# Patient Record
Sex: Female | Born: 1935 | Race: White | Hispanic: No | Marital: Married | Smoking: Never smoker
Health system: Southern US, Community
[De-identification: ages and names within clinical notes are randomized; demographics above are authoritative.]

## PROBLEM LIST (undated history)

## (undated) DIAGNOSIS — K219 Gastro-esophageal reflux disease without esophagitis: Secondary | ICD-10-CM

## (undated) DIAGNOSIS — E079 Disorder of thyroid, unspecified: Secondary | ICD-10-CM

## (undated) DIAGNOSIS — I10 Essential (primary) hypertension: Secondary | ICD-10-CM

## (undated) HISTORY — PX: EYE SURGERY: SHX253

## (undated) HISTORY — PX: CHOLECYSTECTOMY: SHX55

## (undated) HISTORY — PX: REPLACEMENT TOTAL KNEE BILATERAL: SUR1225

---

## 2009-06-20 ENCOUNTER — Emergency Department: Payer: Self-pay | Admitting: Unknown Physician Specialty

## 2009-07-07 ENCOUNTER — Emergency Department: Payer: Self-pay | Admitting: Emergency Medicine

## 2011-07-08 ENCOUNTER — Inpatient Hospital Stay: Payer: Self-pay | Admitting: Unknown Physician Specialty

## 2016-07-01 ENCOUNTER — Emergency Department: Payer: Medicare Other

## 2016-07-01 ENCOUNTER — Emergency Department
Admission: EM | Admit: 2016-07-01 | Discharge: 2016-07-01 | Disposition: A | Discharge status: Home / Self Care | Payer: Medicare Other | Attending: Emergency Medicine | Admitting: Emergency Medicine

## 2016-07-01 ENCOUNTER — Encounter: Payer: Self-pay | Admitting: Emergency Medicine

## 2016-07-01 DIAGNOSIS — Y999 Unspecified external cause status: Secondary | ICD-10-CM | POA: Diagnosis not present

## 2016-07-01 DIAGNOSIS — Y92511 Restaurant or cafe as the place of occurrence of the external cause: Secondary | ICD-10-CM | POA: Diagnosis not present

## 2016-07-01 DIAGNOSIS — S46911A Strain of unspecified muscle, fascia and tendon at shoulder and upper arm level, right arm, initial encounter: Secondary | ICD-10-CM | POA: Insufficient documentation

## 2016-07-01 DIAGNOSIS — I10 Essential (primary) hypertension: Secondary | ICD-10-CM | POA: Insufficient documentation

## 2016-07-01 DIAGNOSIS — Y9389 Activity, other specified: Secondary | ICD-10-CM | POA: Diagnosis not present

## 2016-07-01 DIAGNOSIS — W010XXA Fall on same level from slipping, tripping and stumbling without subsequent striking against object, initial encounter: Secondary | ICD-10-CM | POA: Diagnosis not present

## 2016-07-01 DIAGNOSIS — M25511 Pain in right shoulder: Secondary | ICD-10-CM | POA: Diagnosis present

## 2016-07-01 HISTORY — DX: Essential (primary) hypertension: I10

## 2016-07-01 HISTORY — DX: Disorder of thyroid, unspecified: E07.9

## 2016-07-01 NOTE — ED Triage Notes (Signed)
Pt to ED c/o right shoulder pain after fall tonight.  Patient states tripped while at dinner and fell on shoulder, denies hitting head or LOC.  Pt is A&Ox4, speaking in complete and coherent sentences, chest rise even and unlabored, no obvious deformity or swelling.  States pain with movement.  Pt took 2 advil 1hr PTA.

## 2016-07-01 NOTE — Discharge Instructions (Signed)
As we discussed, your shoulder x-rays are abnormal, but it appears chronic.  Since you are not having any significant pain or tenderness with range of motion or use of your arm, we feel it is reasonable for you to go home and use over-the-counter pain medication as needed.  You may follow up with an orthopedic surgeon of your choosing although we do recommend calling Dr. Rosita KeaMenz.    Return to the emergency department if you develop new or worsening symptoms that concern you.

## 2016-07-01 NOTE — ED Provider Notes (Signed)
[facility] Emergency Department Provider Note  ____________________________________________   First MD Initiated Contact with Patient 07/01/16 2118     (approximate)  I have reviewed the triage vital signs and the nursing notes.   HISTORY  Chief Complaint Fall and Shoulder Pain    HPI Katie Coleman is a 82 y.o. female with a history of chronic bilateral shoulder degeneration who presents for evaluation of pain in her right shoulder after a fall at a restaurant tonight.  She had a mechanical fall where she tripped "over my own feet" and landed on her right shoulder.  She did not strike her head, did not lose consciousness, and has no pain in her neck and no headache.  She reports that she had acute onset of moderate pain in the right shoulder near her neck but without any obvious deformity.  She took 2 ibuprofen about an hour prior to arrival and states that the pain is much better now.  She has no limitation of range of motion.  The pain is reproduced when she palpates at the base of her neck on the right side.  She has no other injuries.   Past Medical History:  Diagnosis Date  . Hypertension   . Thyroid disease     There are no active problems to display for this patient.   Past Surgical History:  Procedure Laterality Date  . CHOLECYSTECTOMY    . EYE SURGERY    . REPLACEMENT TOTAL KNEE BILATERAL      Prior to Admission medications   Not on File    Allergies Review of patient's allergies indicates no known allergies.  History reviewed. No pertinent family history.  Social History Social History  Substance Use Topics  . Smoking status: Never Smoker  . Smokeless tobacco: Never Used  . Alcohol use No    Review of Systems Constitutional: No fever/chills Eyes: No visual changes. ENT: No sore throat. Cardiovascular: Denies chest pain. Respiratory: Denies shortness of breath. Gastrointestinal: No abdominal pain.  No nausea, no  vomiting.  No diarrhea.  No constipation. Genitourinary: Negative for dysuria. Musculoskeletal: Pain and tenderness after fall to the base of her neck/right shoulder. Negative for back pain and cervical spinal neck pain. Skin: Negative for rash. Neurological: Negative for headaches, focal weakness or numbness.  10-point ROS otherwise negative.  ____________________________________________   PHYSICAL EXAM:  VITAL SIGNS: ED Triage Vitals  Enc Vitals Group     BP 07/01/16 1923 (!) 216/113     Pulse Rate 07/01/16 1923 66     Resp 07/01/16 1923 16     Temp 07/01/16 1923 97.7 F (36.5 C)     Temp Source 07/01/16 1923 Oral     SpO2 07/01/16 1923 96 %     Weight 07/01/16 1923 175 lb (79.4 kg)     Height 07/01/16 1923 5\' 6"  (1.676 m)     Head Circumference --      Peak Flow --      Pain Score 07/01/16 1924 3     Pain Loc --      Pain Edu? --      Excl. in GC? --     Constitutional: Alert and oriented. Well appearing and in no acute distress. Eyes: Conjunctivae are normal. PERRL. EOMI. Head: Atraumatic. Nose: No congestion/rhinnorhea. Mouth/Throat: Mucous membranes are moist.  Oropharynx non-erythematous. Neck: No stridor.  No meningeal signs.  No cervical spine tenderness to palpation. Cardiovascular: Normal rate, regular rhythm. Good peripheral circulation. Grossly  normal heart sounds. Respiratory: Normal respiratory effort.  No retractions. Lungs CTAB. Gastrointestinal: Soft and nontender. No distention.  Musculoskeletal: Mild tenderness to palpation of the soft tissues at the base of the neck and the start of the right shoulder.  She has no bony tenderness to palpation and normal and nontender range of motion of the right shoulder.  No other injuries are evident Neurologic:  Normal speech and language. No gross focal neurologic deficits are appreciated.  Skin:  Skin is warm, dry and intact. No rash noted. Psychiatric: Mood and affect are normal. Speech and behavior are  normal.  ____________________________________________   LABS (all labs ordered are listed, but only abnormal results are displayed)  Labs Reviewed - No data to display ____________________________________________  EKG  None - EKG not ordered by ED physician ____________________________________________  RADIOLOGY   Dg Shoulder Right  Result Date: 07/01/2016 CLINICAL DATA:  Right shoulder pain after fall tonight. EXAM: RIGHT SHOULDER - 2+ VIEW COMPARISON:  None. FINDINGS: Deformity of the right proximal humerus with resorption of the right humeral head on a chronic basis. This had started back on the chest radiograph of 2012 but has clearly progressed since that time. The slight convexity of the remaining proximal humeral metaphysis pseudo articulates with the glenoid. There is a linear calcification below the glenohumeral joint which could be a chronic or acute bony fragment or a vascular calcification. The he articulating humeral neck does seem to project over the glenoid so I do not believe that it is dislocated. Subacromial morphology is type 2 (curved). No AC joint malalignment. I do not really appreciate an acute fracture ; the glenoid is suboptimally seen because of its orientation. IMPRESSION: 1. Severe deformity the right proximal humerus with resorbed humeral head, and apparent pseudoarticulation of the proximal humeral metaphysis with the glenoid. Glenoid flattening and irregularity is suggested with spurring, and I am suspicious for free fragments in the axillary pouch which are more likely chronic than acute. Given the degree of deformity and irregularity of these bony structures, possibility of a small acute fracture becomes difficult to rule out with confidence on conventional radiography. CT could be utilized to assess further. Electronically Signed   By: Gaylyn Rong M.D.   On: 07/01/2016 20:04     ____________________________________________   PROCEDURES  Procedure(s) performed:   Procedures   Critical Care performed: No ____________________________________________   INITIAL IMPRESSION / ASSESSMENT AND PLAN / ED COURSE  Pertinent labs & imaging results that were available during my care of the patient were reviewed by me and considered in my medical decision making (see chart for details).  According to the radiology report, the patient has an impressive x-ray of the right shoulder but this is consistent with a history she provided.  She has almost no pain or tenderness at this time and the tenderness she does have appears musculoskeletal.  Her pain improved significantly after ibuprofen.  She has normal range of motion.  She does not even want a sling.  I do not feel that additional imaging is required at this time.  She is going to follow up with a orthopedic surgeon at [facility].  I gave my usual and customary return precautions.      ____________________________________________  FINAL CLINICAL IMPRESSION(S) / ED DIAGNOSES  Final diagnoses:  Strain of right shoulder, initial encounter     MEDICATIONS GIVEN DURING THIS VISIT:  Medications - No data to display   NEW OUTPATIENT MEDICATIONS STARTED DURING THIS VISIT:  New Prescriptions  No medications on file    Modified Medications   No medications on file    Discontinued Medications   No medications on file     Note:  This document was prepared using Dragon voice recognition software and may include unintentional dictation errors.    Loleta Roseory Victorious Kundinger, MD 07/01/16 2142

## 2016-07-01 NOTE — ED Notes (Signed)
Pt. Verbalizes understanding of d/c instructions and follow-up. VS stable and pain controlled per pt.  Pt. In NAD at time of d/c and denies further concerns regarding this visit. Pt. Stable at the time of departure from the unit, departing unit by the safest and most appropriate manner per that pt condition and limitations. Pt advised to return to the ED at any time for emergent concerns, or for new/worsening symptoms.   

## 2016-08-27 ENCOUNTER — Emergency Department
Admission: EM | Admit: 2016-08-27 | Discharge: 2016-08-27 | Disposition: A | Discharge status: Home / Self Care | Payer: Medicare Other | Attending: Emergency Medicine | Admitting: Emergency Medicine

## 2016-08-27 DIAGNOSIS — I10 Essential (primary) hypertension: Secondary | ICD-10-CM | POA: Diagnosis not present

## 2016-08-27 DIAGNOSIS — Y939 Activity, unspecified: Secondary | ICD-10-CM | POA: Diagnosis not present

## 2016-08-27 DIAGNOSIS — W01198A Fall on same level from slipping, tripping and stumbling with subsequent striking against other object, initial encounter: Secondary | ICD-10-CM | POA: Diagnosis not present

## 2016-08-27 DIAGNOSIS — S0181XA Laceration without foreign body of other part of head, initial encounter: Secondary | ICD-10-CM | POA: Insufficient documentation

## 2016-08-27 DIAGNOSIS — S0101XA Laceration without foreign body of scalp, initial encounter: Secondary | ICD-10-CM | POA: Diagnosis not present

## 2016-08-27 DIAGNOSIS — Y929 Unspecified place or not applicable: Secondary | ICD-10-CM | POA: Insufficient documentation

## 2016-08-27 DIAGNOSIS — Y999 Unspecified external cause status: Secondary | ICD-10-CM | POA: Diagnosis not present

## 2016-08-27 DIAGNOSIS — S0990XA Unspecified injury of head, initial encounter: Secondary | ICD-10-CM

## 2016-08-27 DIAGNOSIS — W19XXXA Unspecified fall, initial encounter: Secondary | ICD-10-CM

## 2016-08-27 MED ORDER — LIDOCAINE-EPINEPHRINE (PF) 2 %-1:200000 IJ SOLN
10.0000 mL | Freq: Once | INTRAMUSCULAR | Status: DC
  Filled 2016-08-27: qty 20

## 2016-08-27 MED ORDER — LIDOCAINE-EPINEPHRINE 2 %-1:100000 IJ SOLN
INTRAMUSCULAR | Status: DC
Start: 2016-08-27 — End: 2016-08-27
  Filled 2016-08-27: qty 1.7

## 2016-08-27 MED ORDER — BUPIVACAINE-EPINEPHRINE (PF) 0.5% -1:200000 IJ SOLN
10.0000 mL | Freq: Once | INTRAMUSCULAR | Status: DC
  Filled 2016-08-27: qty 10

## 2016-08-27 NOTE — ED Notes (Signed)
Laceration cart to bedside 

## 2016-08-27 NOTE — Discharge Instructions (Signed)
1. Sutures and staples should be removed in 7-10 days. 2. You may take Tylenol as needed for discomfort. 3. Please take her blood pressure medicine which you get home. 4. Return to the ER for worsening symptoms, persistent vomiting, lethargy, difficulty breathing, or other concerns.

## 2016-08-27 NOTE — ED Provider Notes (Signed)
[facility] Emergency Department Provider Note   ____________________________________________   First MD Initiated Contact with Patient 08/27/16 0129     (approximate)  I have reviewed the triage vital signs and the nursing notes.   HISTORY  Chief Complaint Fall    HPI Katie Coleman is a 82 y.o. female who presents to the ED from home accompanied by son and spouse with a chief complaint of fall with scalp laceration. Patient reports tripping over a rug, falling forward and striking her left head on a wooden cabinet. Incident occurred approximately 11:30 PM. Denies associated LOC, neck pain, vision changes, chest pain, shortness breath, abdominal pain, nausea, vomiting, diarrhea, dizziness, numbness/tingling. Tetanus is up-to-date. Nothing makes her symptoms better or worse. Patient does not take anticoagulant.   Past Medical History:  Diagnosis Date  . Hypertension   . Thyroid disease     There are no active problems to display for this patient.   Past Surgical History:  Procedure Laterality Date  . CHOLECYSTECTOMY    . EYE SURGERY    . REPLACEMENT TOTAL KNEE BILATERAL      Prior to Admission medications   Not on File    Allergies Latex  No family history on file.  Social History Social History  Substance Use Topics  . Smoking status: Never Smoker  . Smokeless tobacco: Never Used  . Alcohol use No    Review of Systems  Constitutional: No fever/chills. Eyes: No visual changes. ENT: No sore throat. Cardiovascular: Denies chest pain. Respiratory: Denies shortness of breath. Gastrointestinal: No abdominal pain.  No nausea, no vomiting.  No diarrhea.  No constipation. Genitourinary: Negative for dysuria. Musculoskeletal: Negative for back pain. Skin: Positive for left scalp laceration. Negative for rash. Neurological: Negative for headaches, focal weakness or numbness.  10-point ROS otherwise  negative.  ____________________________________________   PHYSICAL EXAM:  VITAL SIGNS: ED Triage Vitals  Enc Vitals Group     BP 08/27/16 0058 (!) 201/67     Pulse Rate 08/27/16 0058 61     Resp 08/27/16 0058 18     Temp 08/27/16 0058 97.6 F (36.4 C)     Temp Source 08/27/16 0058 Oral     SpO2 08/27/16 0058 94 %     Weight 08/27/16 0058 170 lb (77.1 kg)     Height 08/27/16 0058 5\' 6"  (1.676 m)     Head Circumference --      Peak Flow --      Pain Score 08/27/16 0059 2     Pain Loc --      Pain Edu? --      Excl. in GC? --     Constitutional: Alert and oriented. Well appearing and in no acute distress. Eyes: Conjunctivae are normal. PERRL. EOMI. Head: 2 lacerations to left head - #1: approximately 6 cm curved laceration at the hairline which is nonbleeding, #2: approximately 6 cm linear left parietal scalp laceration which is nonbleeding Nose: No congestion/rhinnorhea. Mouth/Throat: Mucous membranes are moist.  Oropharynx non-erythematous. Neck: No stridor.  No cervical spine tenderness to palpation. Cardiovascular: Normal rate, regular rhythm. Grossly normal heart sounds.  Good peripheral circulation. Respiratory: Normal respiratory effort.  No retractions. Lungs CTAB. Gastrointestinal: Soft and nontender. No distention. No abdominal bruits. No CVA tenderness. Musculoskeletal: No lower extremity tenderness nor edema.  No joint effusions. Neurologic:  Normal speech and language. No gross focal neurologic deficits are appreciated. No gait instability. Skin:  Skin is warm, dry and intact. No  rash noted. Psychiatric: Mood and affect are normal. Speech and behavior are normal.  ____________________________________________   LABS (all labs ordered are listed, but only abnormal results are displayed)  Labs Reviewed - No data to  display ____________________________________________  EKG  None ____________________________________________  RADIOLOGY  None ____________________________________________   PROCEDURES  Procedure(s) performed:   LACERATION REPAIR Performed by: Irean HongSUNG,JADE J Authorized by: Irean HongSUNG,JADE J Consent: Verbal consent obtained. Risks and benefits: risks, benefits and alternatives were discussed Consent given by: patient Patient identity confirmed: provided demographic data Prepped and Draped in normal sterile fashion Wound explored  Laceration Location: Left forehead/hairline  Laceration Length: 6cm  No Foreign Bodies seen or palpated  Anesthesia: local infiltration  Local anesthetic: lidocaine 2% w/ epinephrine  Anesthetic total: 5 ml  Irrigation method: syringe Amount of cleaning: standard  Skin closure: 4-0 Nylon  Number of sutures: 5  Technique: Standard sterile  Patient tolerance: Patient tolerated the procedure well with no immediate complications.   LACERATION REPAIR Performed by: Irean HongSUNG,JADE J Authorized by: Irean HongSUNG,JADE J Consent: Verbal consent obtained. Risks and benefits: risks, benefits and alternatives were discussed Consent given by: patient Patient identity confirmed: provided demographic data Prepped and Draped in normal sterile fashion Wound explored  Laceration Location: Left parietal scalp  Laceration Length: 6cm  No Foreign Bodies seen or palpated  Anesthesia: local infiltration  Local anesthetic: lidocaine 2% w/ epinephrine  Anesthetic total: 5 ml  Irrigation method: syringe Amount of cleaning: standard  Skin closure: staples  Number of staples: 6  Technique: standard  Patient tolerance: Patient tolerated the procedure well with no immediate complications.  Procedures  Critical Care performed: No  ____________________________________________   INITIAL IMPRESSION / ASSESSMENT AND PLAN / ED COURSE  Pertinent labs & imaging  results that were available during my care of the patient were reviewed by me and considered in my medical decision making (see chart for details).  82 year old female who presents with 2 scalp lacerations status post mechanical fall. Tolerated suture repair well. Missed her evening dose of blood pressure medicine and cholesterol medicine; will take these when she gets home. She is neurologically intact without focal deficits. Strict return precautions given. Patient and family verbalize understanding and agree with plan of care.  Clinical Course      ____________________________________________   FINAL CLINICAL IMPRESSION(S) / ED DIAGNOSES  Final diagnoses:  Fall, initial encounter  Minor head injury, initial encounter  Laceration of scalp, initial encounter      NEW MEDICATIONS STARTED DURING THIS VISIT:  New Prescriptions   No medications on file     Note:  This document was prepared using Dragon voice recognition software and may include unintentional dictation errors.    Irean HongJade J Sung, MD 08/27/16 [phone]

## 2016-08-27 NOTE — ED Triage Notes (Signed)
Pt fell at home today tripping over a rug. She hit her head on a wooden cabinet and has lac noted to area. No loc or co neck or back paim.

## 2016-08-27 NOTE — ED Notes (Signed)
Pt. Verbalizes understanding of d/c instructions and follow-up. VS stable and pain controlled per pt.  Pt. In NAD at time of d/c and denies further concerns regarding this visit. Pt. Stable at the time of departure from the unit, departing unit by the safest and most appropriate manner per that pt condition and limitations. Pt advised to return to the ED at any time for emergent concerns, or for new/worsening symptoms.   

## 2016-08-27 NOTE — ED Notes (Signed)
5 stitches applied, 6 staples applied to scalp by MD Dolores FrameSung.

## 2018-05-09 ENCOUNTER — Ambulatory Visit
Admission: EM | Admit: 2018-05-09 | Discharge: 2018-05-09 | Disposition: A | Discharge status: Home / Self Care | Payer: Medicare Other | Attending: Family Medicine | Admitting: Family Medicine

## 2018-05-09 ENCOUNTER — Other Ambulatory Visit: Payer: Self-pay

## 2018-05-09 DIAGNOSIS — L03032 Cellulitis of left toe: Secondary | ICD-10-CM | POA: Diagnosis not present

## 2018-05-09 DIAGNOSIS — S91205A Unspecified open wound of left lesser toe(s) with damage to nail, initial encounter: Secondary | ICD-10-CM | POA: Diagnosis not present

## 2018-05-09 HISTORY — DX: Gastro-esophageal reflux disease without esophagitis: K21.9

## 2018-05-09 MED ORDER — CEPHALEXIN 500 MG PO CAPS: 500.0000 mg | ORAL_CAPSULE | Freq: Two times a day (BID) | ORAL | 0 refills | Status: DC

## 2018-05-09 NOTE — ED Triage Notes (Signed)
Pt with two days of left foot, 5th toe pain, redness and toe nail lifted off nailbed. Denies trauma

## 2018-05-09 NOTE — ED Provider Notes (Signed)
[facility]    CSN: [id number] Arrival date & time: 05/09/18  1549     History   Chief Complaint Chief Complaint  Patient presents with  . Toe Pain    HPI Katie Coleman is a 84 y.o. female.   84 yo female with a c/o left foot 5th toe pain, redness and toenail lifted off nailbed. Patient states she does not recall any trauma. Denies any fevers or chills.   The history is provided by the patient.    Past Medical History:  Diagnosis Date  . GERD (gastroesophageal reflux disease)   . Hypertension   . Thyroid disease     There are no active problems to display for this patient.   Past Surgical History:  Procedure Laterality Date  . CHOLECYSTECTOMY    . EYE SURGERY    . REPLACEMENT TOTAL KNEE BILATERAL      OB History   None      Home Medications    Prior to Admission medications   Medication Sig Start Date End Date Taking? Authorizing Provider  hydrochlorothiazide (HYDRODIURIL) 12.5 MG tablet Take by mouth. 04/06/18  Yes [provider]  levothyroxine (SYNTHROID, LEVOTHROID) 88 MCG tablet Take by mouth. 09/02/17  Yes [provider]  atenolol (TENORMIN) 100 MG tablet Take by mouth.    [provider]  cephALEXin (KEFLEX) 500 MG capsule Take 1 capsule (500 mg total) by mouth 2 (two) times daily. 05/09/18   Payton Mccallum, MD  Multiple Vitamin (MULTI-VITAMINS) TABS Take by mouth.    [provider]  ranitidine (ZANTAC) 75 MG tablet  03/28/18   [provider]  rosuvastatin (CRESTOR) 20 MG tablet Take by mouth.    [provider]    Family History History reviewed. No pertinent family history.  Social History Social History   Tobacco Use  . Smoking status: Never Smoker  . Smokeless tobacco: Never Used  Substance Use Topics  . Alcohol use: No  . Drug use: No     Allergies   Codeine; Hydrocodone; Latex; Oxycodone; Promethazine; and Sulfamethoxazole-trimethoprim   Review of  Systems Review of Systems   Physical Exam Triage Vital Signs ED Triage Vitals  Enc Vitals Group     BP 05/09/18 1602 (!) 155/73     Pulse Rate 05/09/18 1602 76     Resp 05/09/18 1602 20     Temp 05/09/18 1602 97.8 F (36.6 C)     Temp Source 05/09/18 1602 Oral     SpO2 05/09/18 1602 98 %     Weight 05/09/18 1606 170 lb (77.1 kg)     Height 05/09/18 1606 5\' 3"  (1.6 m)     Head Circumference --      Peak Flow --      Pain Score 05/09/18 1606 7     Pain Loc --      Pain Edu? --      Excl. in GC? --    No data found.  Updated Vital Signs BP (!) 155/73 (BP Location: Right Arm)   Pulse 76   Temp 97.8 F (36.6 C) (Oral)   Resp 20   Ht 5\' 3"  (1.6 m)   Wt 77.1 kg   SpO2 98%   BMI 30.11 kg/m   Visual Acuity Right Eye Distance:   Left Eye Distance:   Bilateral Distance:    Right Eye Near:   Left Eye Near:    Bilateral Near:     Physical Exam  Constitutional: She appears well-developed and well-nourished. No distress.  Skin: She is not diaphoretic.  Left 5th toe toenail avulsed, completely separated and elevated except for slightly attached to skin; diffuse surrounding blanchable erythema,warmth and tenderness to skin around tip of 5th toe; no drainage  Vitals reviewed.    UC Treatments / Results  Labs (all labs ordered are listed, but only abnormal results are displayed) Labs Reviewed - No data to display  EKG None  Radiology No results found.  Procedures Procedures (including critical care time)  Medications Ordered in UC Medications - No data to display  Initial Impression / Assessment and Plan / UC Course  I have reviewed the triage vital signs and the nursing notes.  Pertinent labs & imaging results that were available during my care of the patient were reviewed by me and considered in my medical decision making (see chart for details).      Final Clinical Impressions(s) / UC Diagnoses   Final diagnoses:  Cellulitis of toe, left    Discharge Instructions   None    ED Prescriptions    Medication Sig Dispense Auth. Provider   cephALEXin (KEFLEX) 500 MG capsule Take 1 capsule (500 mg total) by mouth 2 (two) times daily. 14 capsule Payton Mccallum, MD     1. diagnosis reviewed with patient; toenail removed 2. rx as per orders above; reviewed possible side effects, interactions, risks and benefits  3. Recommend supportive treatment with routine wound care 4. Follow-up prn if symptoms worsen or don't improve Controlled Substance Prescriptions [place] Controlled Substance Registry consulted? Not Applicable   Payton Mccallum, MD 05/09/18 [phone]

## 2018-05-11 ENCOUNTER — Ambulatory Visit: Payer: Medicare Other | Admitting: Podiatry

## 2018-09-17 ENCOUNTER — Ambulatory Visit (INDEPENDENT_AMBULATORY_CARE_PROVIDER_SITE_OTHER)
Admission: EM | Admit: 2018-09-17 | Discharge: 2018-09-17 | Disposition: A | Discharge status: Home / Self Care | Payer: Medicare Other | Source: Home / Self Care

## 2018-09-17 ENCOUNTER — Other Ambulatory Visit: Payer: Self-pay

## 2018-09-17 ENCOUNTER — Emergency Department
Admission: EM | Admit: 2018-09-17 | Discharge: 2018-09-17 | Disposition: A | Discharge status: Home / Self Care | Payer: Medicare Other | Attending: Emergency Medicine | Admitting: Emergency Medicine

## 2018-09-17 ENCOUNTER — Emergency Department: Payer: Medicare Other

## 2018-09-17 DIAGNOSIS — I10 Essential (primary) hypertension: Secondary | ICD-10-CM | POA: Insufficient documentation

## 2018-09-17 DIAGNOSIS — Y998 Other external cause status: Secondary | ICD-10-CM | POA: Insufficient documentation

## 2018-09-17 DIAGNOSIS — Z9049 Acquired absence of other specified parts of digestive tract: Secondary | ICD-10-CM | POA: Diagnosis not present

## 2018-09-17 DIAGNOSIS — S0990XA Unspecified injury of head, initial encounter: Secondary | ICD-10-CM

## 2018-09-17 DIAGNOSIS — S0181XA Laceration without foreign body of other part of head, initial encounter: Secondary | ICD-10-CM | POA: Insufficient documentation

## 2018-09-17 DIAGNOSIS — W1809XA Striking against other object with subsequent fall, initial encounter: Secondary | ICD-10-CM | POA: Diagnosis not present

## 2018-09-17 DIAGNOSIS — Y929 Unspecified place or not applicable: Secondary | ICD-10-CM | POA: Insufficient documentation

## 2018-09-17 DIAGNOSIS — Y9389 Activity, other specified: Secondary | ICD-10-CM | POA: Diagnosis not present

## 2018-09-17 DIAGNOSIS — W0110XA Fall on same level from slipping, tripping and stumbling with subsequent striking against unspecified object, initial encounter: Secondary | ICD-10-CM | POA: Insufficient documentation

## 2018-09-17 DIAGNOSIS — S0191XA Laceration without foreign body of unspecified part of head, initial encounter: Secondary | ICD-10-CM | POA: Diagnosis not present

## 2018-09-17 DIAGNOSIS — Z91040 Latex allergy status: Secondary | ICD-10-CM | POA: Diagnosis not present

## 2018-09-17 DIAGNOSIS — Z96653 Presence of artificial knee joint, bilateral: Secondary | ICD-10-CM | POA: Insufficient documentation

## 2018-09-17 DIAGNOSIS — Z79899 Other long term (current) drug therapy: Secondary | ICD-10-CM | POA: Diagnosis not present

## 2018-09-17 MED ORDER — BACITRACIN ZINC 500 UNIT/GM EX OINT
TOPICAL_OINTMENT | Freq: Two times a day (BID) | CUTANEOUS | Status: DC
  Filled 2018-09-17: qty 0.9

## 2018-09-17 MED ORDER — LIDOCAINE HCL (PF) 1 % IJ SOLN
5.0000 mL | Freq: Once | INTRAMUSCULAR | Status: DC
  Filled 2018-09-17: qty 5

## 2018-09-17 NOTE — ED Triage Notes (Signed)
Pt fell face first in her bathroom today, no loss of consciousness and no headache reported. Son said she had a scale right in the bathroom and may of hit the edge of the scale when she fell. Has a abrasion on her nose and a laceration above her right eye.

## 2018-09-17 NOTE — ED Provider Notes (Signed)
[facility]    CSN: [id number] Arrival date & time: 09/17/18  1414     History   Chief Complaint Chief Complaint  Patient presents with  . Fall    HPI Katie Coleman is a 85 y.o. female. Patient presents with her son today for a fall that occurred after she tripped on carpet about 2-3 hours ago. She did not pass out. Denies confusion and headache. She does have a laceration of the forehead and abrasion of the nose. Patient denies numbness/weakness/tingling. She denies nausea/vomiting and fatigue. Denies any LOC and any other injuries. She is not taking any anticoagulants. She and her son have no other concerns today.  HPI  Past Medical History:  Diagnosis Date  . GERD (gastroesophageal reflux disease)   . Hypertension   . Thyroid disease     There are no active problems to display for this patient.   Past Surgical History:  Procedure Laterality Date  . CHOLECYSTECTOMY    . EYE SURGERY    . REPLACEMENT TOTAL KNEE BILATERAL      OB History   No obstetric history on file.      Home Medications    Prior to Admission medications   Medication Sig Start Date End Date Taking? Authorizing Provider  atenolol (TENORMIN) 100 MG tablet Take by mouth.   Yes [provider]  hydrochlorothiazide (HYDRODIURIL) 12.5 MG tablet Take by mouth. 04/06/18  Yes [provider]  levothyroxine (SYNTHROID, LEVOTHROID) 88 MCG tablet Take by mouth. 09/02/17  Yes [provider]  Multiple Vitamin (MULTI-VITAMINS) TABS Take by mouth.   Yes [provider]  ranitidine (ZANTAC) 75 MG tablet  03/28/18  Yes [provider]  rosuvastatin (CRESTOR) 20 MG tablet Take by mouth.   Yes [provider]  cephALEXin (KEFLEX) 500 MG capsule Take 1 capsule (500 mg total) by mouth 2 (two) times daily. 05/09/18   Payton Mccallum, MD    Family History History reviewed. No pertinent family history.  Social History Social History   Tobacco Use    . Smoking status: Never Smoker  . Smokeless tobacco: Never Used  Substance Use Topics  . Alcohol use: No  . Drug use: No     Allergies   Codeine; Hydrocodone; Latex; Oxycodone; Promethazine; and Sulfamethoxazole-trimethoprim   Review of Systems Review of Systems  Constitutional: Negative for fatigue and fever.  HENT: Negative for congestion, ear pain and rhinorrhea.   Eyes: Negative for photophobia, pain, discharge and visual disturbance.  Respiratory: Negative for cough and shortness of breath.   Cardiovascular: Negative for chest pain and palpitations.  Gastrointestinal: Negative for abdominal pain, nausea and vomiting.  Genitourinary: Negative for flank pain.  Musculoskeletal: Negative for arthralgias, back pain, gait problem, myalgias, neck pain and neck stiffness.  Skin: Positive for wound. Negative for color change and rash.  Neurological: Negative for dizziness, syncope, weakness, numbness and headaches.  Hematological: Does not bruise/bleed easily.  Psychiatric/Behavioral: Negative for confusion.     Physical Exam Triage Vital Signs ED Triage Vitals  Enc Vitals Group     BP 09/17/18 1503 (!) 218/88     Pulse Rate 09/17/18 1503 63     Resp 09/17/18 1503 18     Temp 09/17/18 1503 97.8 F (36.6 C)     Temp Source 09/17/18 1503 Oral     SpO2 09/17/18 1503 96 %     Weight 09/17/18 1501 160 lb (72.6 kg)     Height 09/17/18 1501 5\' 6"  (  1.676 m)     Head Circumference --      Peak Flow --      Pain Score 09/17/18 1501 3     Pain Loc --      Pain Edu? --      Excl. in GC? --    No data found.  Updated Vital Signs BP (!) 218/88 Comment: did not take her bp medication  Pulse 63   Temp 97.8 F (36.6 C) (Oral)   Resp 18   Ht 5\' 6"  (1.676 m)   Wt 160 lb (72.6 kg)   SpO2 96%   BMI 25.82 kg/m   Physical Exam Vitals signs and nursing note reviewed.  Constitutional:      General: She is not in acute distress.    Appearance: Normal appearance. She is normal  weight. She is not ill-appearing.  HENT:     Head: Normocephalic.     Right Ear: Tympanic membrane, ear canal and external ear normal.     Left Ear: Tympanic membrane, ear canal and external ear normal.     Nose: No rhinorrhea.     Mouth/Throat:     Mouth: Mucous membranes are moist.     Pharynx: Oropharynx is clear.  Eyes:     General: No scleral icterus.    Extraocular Movements: Extraocular movements intact.     Pupils: Pupils are equal, round, and reactive to light.  Neck:     Musculoskeletal: Normal range of motion and neck supple. No muscular tenderness.  Cardiovascular:     Rate and Rhythm: Normal rate and regular rhythm.     Pulses: Normal pulses.     Heart sounds: Normal heart sounds.  Pulmonary:     Effort: Pulmonary effort is normal.     Breath sounds: Normal breath sounds.  Skin:    General: Skin is warm and dry.     Comments: 7 cm laceration forehead, small abrasion right side of nose  Neurological:     General: No focal deficit present.     Mental Status: She is alert and oriented to person, place, and time.     Cranial Nerves: No cranial nerve deficit.     Motor: No weakness.  Psychiatric:        Mood and Affect: Mood normal.        Behavior: Behavior normal.      UC Treatments / Results  Labs (all labs ordered are listed, but only abnormal results are displayed) Labs Reviewed - No data to display  EKG None  Radiology No results found.  Procedures Procedures (including critical care time)  Medications Ordered in UC Medications - No data to display  Initial Impression / Assessment and Plan / UC Course  I have reviewed the triage vital signs and the nursing notes.  Pertinent labs & imaging results that were available during my care of the patient were reviewed by me and considered in my medical decision making (see chart for details).   85 year old female presenting with head injury, laceration, and hypertension. Patient in stable condition with  no focal deficits. Advised to go to ER for CT scan to rule out intracranial pathologies given age and injury. Patient and son reluctant to go to hospital, but I explained that her BP is very high and in the case of an intracranial bleed, this could be life threatening in. Patient and son eventually agreed to go to ER for evaluation given the risks associated with injury. Patient's  head wound with bleeding well controlled--gauze and Coban applied before discharge.   Final Clinical Impressions(s) / UC Diagnoses   Final diagnoses:  Injury of head, initial encounter  Laceration of head without foreign body, unspecified part of head, initial encounter  Essential hypertension     Discharge Instructions     GO IMMEDIATELY TO EMERGENCY DEPARTMENT. EXPLAINED TO PATIENT AND SON THAT SHE IS 38 YEARS OLD WITH A HEAD INJURY AND LACERATION. BLOOD PRESSURE IS VERY ELEVATED AND SHE NEEDS A CT SCAN OF HER HEAD TO ENSURE THERE IS NO INTERNAL BLEEDING.    ED Prescriptions    None     Controlled Substance Prescriptions [place] Controlled Substance Registry consulted? Not Applicable   Gareth Morgan 09/19/18 1844

## 2018-09-17 NOTE — ED Provider Notes (Signed)
[facility] Emergency Department Provider Note ____________   First MD Initiated Contact with Patient 09/17/18 1906     (approximate)  I have reviewed the triage vital signs and the nursing notes.   HISTORY  Chief Complaint Fall and Laceration    HPI Katie Coleman is a 85 y.o. female with below list of chronic medical conditions presents to the emergency department following accidental trip and fall resultant forehead injury.  Patient denies taking any anticoagulation medication.  Patient denies any loss of consciousness.   Past Medical History:  Diagnosis Date  . GERD (gastroesophageal reflux disease)   . Hypertension   . Thyroid disease     There are no active problems to display for this patient.   Past Surgical History:  Procedure Laterality Date  . CHOLECYSTECTOMY    . EYE SURGERY    . REPLACEMENT TOTAL KNEE BILATERAL      Prior to Admission medications   Medication Sig Start Date End Date Taking? Authorizing Provider  atenolol (TENORMIN) 100 MG tablet Take by mouth.    [provider]  cephALEXin (KEFLEX) 500 MG capsule Take 1 capsule (500 mg total) by mouth 2 (two) times daily. 05/09/18   Payton Mccallumonty, [place], MD  hydrochlorothiazide (HYDRODIURIL) 12.5 MG tablet Take by mouth. 04/06/18   [provider]  levothyroxine (SYNTHROID, LEVOTHROID) 88 MCG tablet Take by mouth. 09/02/17   [provider]  Multiple Vitamin (MULTI-VITAMINS) TABS Take by mouth.    [provider]  ranitidine (ZANTAC) 75 MG tablet  03/28/18   [provider]  rosuvastatin (CRESTOR) 20 MG tablet Take by mouth.    [provider]    Allergies Codeine; Hydrocodone; Latex; Oxycodone; Promethazine; and Sulfamethoxazole-trimethoprim  No family history on file.  Social History Social History   Tobacco Use  . Smoking status: Never Smoker  . Smokeless tobacco: Never Used  Substance Use Topics  . Alcohol use: No  .  Drug use: No    Review of Systems Constitutional: No fever/chills Eyes: No visual changes. ENT: No sore throat. Cardiovascular: Denies chest pain. Respiratory: Denies shortness of breath. Gastrointestinal: No abdominal pain.  No nausea, no vomiting.  No diarrhea.  No constipation. Genitourinary: Negative for dysuria. Musculoskeletal: Negative for neck pain.  Negative for back pain. Integumentary: Negative for rash.  Positive for right forehead laceration. Neurological: Negative for headaches, focal weakness or numbness.   ____________________________________________   PHYSICAL EXAM:  VITAL SIGNS: ED Triage Vitals  Enc Vitals Group     BP 09/17/18 1734 122/78     Pulse Rate 09/17/18 1734 78     Resp 09/17/18 1734 15     Temp 09/17/18 1734 98.2 F (36.8 C)     Temp Source 09/17/18 1734 Oral     SpO2 09/17/18 1734 98 %     Weight 09/17/18 1731 72.6 kg (160 lb)     Height 09/17/18 1731 1.676 m (5\' 6" )     Head Circumference --      Peak Flow --      Pain Score 09/17/18 1731 0     Pain Loc --      Pain Edu? --      Excl. in GC? --     Constitutional: Alert and oriented. Well appearing and in no acute distress. Eyes: Conjunctivae are normal. PERRL. EOMI. Head: 1 cm linear right forehead laceration bleeding controlled Mouth/Throat: Mucous membranes are moist.  Oropharynx non-erythematous. Neck: No stridor.  Cardiovascular: Normal rate,  regular rhythm. Good peripheral circulation. Grossly normal heart sounds. Respiratory: Normal respiratory effort.  No retractions. Lungs CTAB. Gastrointestinal: Soft and nontender. No distention.  Musculoskeletal: No lower extremity tenderness nor edema. No gross deformities of extremities. Neurologic:  Normal speech and language. No gross focal neurologic deficits are appreciated.  Skin: 7 cm linear right forehead laceration Psychiatric: Mood and affect are normal. Speech and behavior are normal.  ___  RADIOLOGY I, Clyde N BROWN,  personally viewed and evaluated these images (plain radiographs) as part of my medical decision making, as well as reviewing the written report by the radiologist.  ED MD interpretation: CT head maxillofacial and cervical spine revealed no acute intracranial abnormality or cervical fracture dislocation.  Extracranial forehead hematoma noted.  Official radiology report(s): Ct Head Wo Contrast  Addendum Date: 09/17/2018   ADDENDUM REPORT: 09/17/2018 19:42 ADDENDUM: Dictation error in the impression of the original report. As noted in the findings, there is a mild EXTRAcranial hematoma overlying the right frontal bone. The remainder of the report, including the findings, are unchanged. Electronically Signed   By: Charline Bills M.D.   On: 09/17/2018 19:42   Result Date: 09/17/2018 CLINICAL DATA:  Fall EXAM: CT HEAD WITHOUT CONTRAST CT MAXILLOFACIAL WITHOUT CONTRAST CT CERVICAL SPINE WITHOUT CONTRAST TECHNIQUE: Multidetector CT imaging of the head, cervical spine, and maxillofacial structures were performed using the standard protocol without intravenous contrast. Multiplanar CT image reconstructions of the cervical spine and maxillofacial structures were also generated. COMPARISON:  None. FINDINGS: CT HEAD FINDINGS Brain: No evidence of acute infarction, hemorrhage, hydrocephalus, extra-axial collection or mass lesion/mass effect. Subcortical white matter and periventricular small vessel ischemic changes. Global cortical and central atrophy. Vascular: Intracranial atherosclerosis. Skull: Normal. Negative for fracture or focal lesion. Other: Mild extracranial hematoma overlying the right frontal bone (series 3/image 10). CT MAXILLOFACIAL FINDINGS Osseous: No evidence of maxillofacial fracture. Mandible is intact. Bilateral mandibular condyles are well-seated in the TMJs. Orbits: Negative. No traumatic or inflammatory finding. Sinuses: The visualized paranasal sinuses are essentially clear. The mastoid air  cells are unopacified. Soft tissues: Negative. CT CERVICAL SPINE FINDINGS Alignment: Reversal of the normal mid cervical lordosis. Skull base and vertebrae: No acute fracture. No primary bone lesion or focal pathologic process. Soft tissues and spinal canal: No prevertebral fluid or swelling. No visible canal hematoma. Disc levels: Mild to moderate degenerative changes of the mid cervical spine. Spinal canal is patent. Upper chest: 8 mm ground-glass nodule in the left lung apex (series 6/image 32), incompletely visualized. Other: Visualized thyroid is unremarkable. IMPRESSION: Mild intracranial hematoma overlying the right frontal bone. No evidence of calvarial fracture. No evidence of acute intracranial abnormality. Atrophy with small vessel ischemic changes. No evidence of maxillofacial fracture. No evidence of traumatic injury to the cervical spine. Mild to moderate degenerative changes. 8 mm ground-glass nodule in the left lung apex, incompletely visualized. Consider follow-up CT chest without contrast for further characterization, as clinically warranted. Electronically Signed: By: Charline Bills M.D. On: 09/17/2018 19:10   Ct Cervical Spine Wo Contrast  Addendum Date: 09/17/2018   ADDENDUM REPORT: 09/17/2018 19:42 ADDENDUM: Dictation error in the impression of the original report. As noted in the findings, there is a mild EXTRAcranial hematoma overlying the right frontal bone. The remainder of the report, including the findings, are unchanged. Electronically Signed   By: Charline Bills M.D.   On: 09/17/2018 19:42   Result Date: 09/17/2018 CLINICAL DATA:  Fall EXAM: CT HEAD WITHOUT CONTRAST CT MAXILLOFACIAL WITHOUT CONTRAST CT CERVICAL SPINE  WITHOUT CONTRAST TECHNIQUE: Multidetector CT imaging of the head, cervical spine, and maxillofacial structures were performed using the standard protocol without intravenous contrast. Multiplanar CT image reconstructions of the cervical spine and maxillofacial  structures were also generated. COMPARISON:  None. FINDINGS: CT HEAD FINDINGS Brain: No evidence of acute infarction, hemorrhage, hydrocephalus, extra-axial collection or mass lesion/mass effect. Subcortical white matter and periventricular small vessel ischemic changes. Global cortical and central atrophy. Vascular: Intracranial atherosclerosis. Skull: Normal. Negative for fracture or focal lesion. Other: Mild extracranial hematoma overlying the right frontal bone (series 3/image 10). CT MAXILLOFACIAL FINDINGS Osseous: No evidence of maxillofacial fracture. Mandible is intact. Bilateral mandibular condyles are well-seated in the TMJs. Orbits: Negative. No traumatic or inflammatory finding. Sinuses: The visualized paranasal sinuses are essentially clear. The mastoid air cells are unopacified. Soft tissues: Negative. CT CERVICAL SPINE FINDINGS Alignment: Reversal of the normal mid cervical lordosis. Skull base and vertebrae: No acute fracture. No primary bone lesion or focal pathologic process. Soft tissues and spinal canal: No prevertebral fluid or swelling. No visible canal hematoma. Disc levels: Mild to moderate degenerative changes of the mid cervical spine. Spinal canal is patent. Upper chest: 8 mm ground-glass nodule in the left lung apex (series 6/image 32), incompletely visualized. Other: Visualized thyroid is unremarkable. IMPRESSION: Mild intracranial hematoma overlying the right frontal bone. No evidence of calvarial fracture. No evidence of acute intracranial abnormality. Atrophy with small vessel ischemic changes. No evidence of maxillofacial fracture. No evidence of traumatic injury to the cervical spine. Mild to moderate degenerative changes. 8 mm ground-glass nodule in the left lung apex, incompletely visualized. Consider follow-up CT chest without contrast for further characterization, as clinically warranted. Electronically Signed: By: Charline Bills M.D. On: 09/17/2018 19:10   Ct Maxillofacial  Wo Contrast  Addendum Date: 09/17/2018   ADDENDUM REPORT: 09/17/2018 19:42 ADDENDUM: Dictation error in the impression of the original report. As noted in the findings, there is a mild EXTRAcranial hematoma overlying the right frontal bone. The remainder of the report, including the findings, are unchanged. Electronically Signed   By: Charline Bills M.D.   On: 09/17/2018 19:42   Result Date: 09/17/2018 CLINICAL DATA:  Fall EXAM: CT HEAD WITHOUT CONTRAST CT MAXILLOFACIAL WITHOUT CONTRAST CT CERVICAL SPINE WITHOUT CONTRAST TECHNIQUE: Multidetector CT imaging of the head, cervical spine, and maxillofacial structures were performed using the standard protocol without intravenous contrast. Multiplanar CT image reconstructions of the cervical spine and maxillofacial structures were also generated. COMPARISON:  None. FINDINGS: CT HEAD FINDINGS Brain: No evidence of acute infarction, hemorrhage, hydrocephalus, extra-axial collection or mass lesion/mass effect. Subcortical white matter and periventricular small vessel ischemic changes. Global cortical and central atrophy. Vascular: Intracranial atherosclerosis. Skull: Normal. Negative for fracture or focal lesion. Other: Mild extracranial hematoma overlying the right frontal bone (series 3/image 10). CT MAXILLOFACIAL FINDINGS Osseous: No evidence of maxillofacial fracture. Mandible is intact. Bilateral mandibular condyles are well-seated in the TMJs. Orbits: Negative. No traumatic or inflammatory finding. Sinuses: The visualized paranasal sinuses are essentially clear. The mastoid air cells are unopacified. Soft tissues: Negative. CT CERVICAL SPINE FINDINGS Alignment: Reversal of the normal mid cervical lordosis. Skull base and vertebrae: No acute fracture. No primary bone lesion or focal pathologic process. Soft tissues and spinal canal: No prevertebral fluid or swelling. No visible canal hematoma. Disc levels: Mild to moderate degenerative changes of the mid  cervical spine. Spinal canal is patent. Upper chest: 8 mm ground-glass nodule in the left lung apex (series 6/image 32), incompletely visualized. Other: Visualized thyroid  is unremarkable. IMPRESSION: Mild intracranial hematoma overlying the right frontal bone. No evidence of calvarial fracture. No evidence of acute intracranial abnormality. Atrophy with small vessel ischemic changes. No evidence of maxillofacial fracture. No evidence of traumatic injury to the cervical spine. Mild to moderate degenerative changes. 8 mm ground-glass nodule in the left lung apex, incompletely visualized. Consider follow-up CT chest without contrast for further characterization, as clinically warranted. Electronically Signed: By: Charline BillsSriyesh  Krishnan M.D. On: 09/17/2018 19:10     Procedure(s) performed:   Marland Kitchen.Marland Kitchen.Laceration Repair Date/Time: 09/17/2018 9:32 PM Performed by: Darci CurrentBrown, [place] N, MD Authorized by: Darci CurrentBrown, [place] N, MD   Consent:    Consent obtained:  Verbal   Consent given by:  Patient   Risks discussed:  Infection, pain, retained foreign body, poor cosmetic result and poor wound healing Anesthesia (see MAR for exact dosages):    Anesthesia method:  Local infiltration   Local anesthetic:  Lidocaine 1% w/o epi Laceration details:    Location:  Face   Face location:  Forehead   Length (cm):  7 Repair type:    Repair type:  Simple Exploration:    Hemostasis achieved with:  Direct pressure   Wound exploration: entire depth of wound probed and visualized     Contaminated: no   Treatment:    Area cleansed with:  Saline   Amount of cleaning:  Extensive   Irrigation solution:  Sterile saline   Visualized foreign bodies/material removed: no   Skin repair:    Repair method:  Sutures   Suture size:  6-0   Suture technique:  Simple interrupted   Number of sutures:  8 Approximation:    Approximation:  Close Post-procedure details:    Dressing:  Sterile dressing   Patient tolerance of procedure:   Tolerated well, no immediate complications     ____________________________________________   INITIAL IMPRESSION / ASSESSMENT AND PLAN / ED COURSE  As part of my medical decision making, I reviewed the following data within the electronic MEDICAL RECORD NUMBER  85 year old female presented with above-stated history and physical exam following accidental trip and fall with resultant forehead laceration.  CTs of the head maxillofacial and cervical spine negative.  Patient's wound repaired without difficulty. ____________________________________________  FINAL CLINICAL IMPRESSION(S) / ED DIAGNOSES  Final diagnoses:  Injury of head, initial encounter  Laceration of forehead, initial encounter     MEDICATIONS GIVEN DURING THIS VISIT:  Medications  lidocaine (PF) (XYLOCAINE) 1 % injection 5 mL (has no administration in time range)  bacitracin ointment (has no administration in time range)     ED Discharge Orders    None       Note:  This document was prepared using Dragon voice recognition software and may include unintentional dictation errors.    Darci CurrentBrown, [place] N, MD 09/17/18 2133

## 2018-09-17 NOTE — ED Notes (Signed)
Dr given lidocaine

## 2018-09-17 NOTE — ED Notes (Signed)
Spoke to Dr Don Perking about pt and received VO for CT face, head, neck w/o contrast - NO LABS

## 2018-09-17 NOTE — ED Triage Notes (Signed)
Pt tripped and fell hitting head on floor and causing laceration to forehead  - denies N/V, dizziness, headache, blurred vision

## 2018-09-17 NOTE — Discharge Instructions (Signed)
GO IMMEDIATELY TO EMERGENCY DEPARTMENT. EXPLAINED TO PATIENT AND SON THAT SHE IS 85 YEARS OLD WITH A HEAD INJURY AND LACERATION. BLOOD PRESSURE IS VERY ELEVATED AND SHE NEEDS A CT SCAN OF HER HEAD TO ENSURE THERE IS NO INTERNAL BLEEDING.

## 2018-09-24 ENCOUNTER — Ambulatory Visit
Admission: EM | Admit: 2018-09-24 | Discharge: 2018-09-24 | Disposition: A | Discharge status: Home / Self Care | Payer: Medicare Other | Attending: Family Medicine | Admitting: Family Medicine

## 2018-09-24 DIAGNOSIS — Z4802 Encounter for removal of sutures: Secondary | ICD-10-CM

## 2018-09-24 DIAGNOSIS — I10 Essential (primary) hypertension: Secondary | ICD-10-CM

## 2018-09-24 NOTE — ED Triage Notes (Signed)
Pt here for suture removal and bp is still high. Unsure if she took her bp medication today, sutures above the right eye look well healed and no drainage.

## 2018-09-24 NOTE — ED Provider Notes (Signed)
[facility]    CSN: [id number] Arrival date & time: 09/24/18  1417     History   Chief Complaint Chief Complaint  Patient presents with  . Suture / Staple Removal    HPI Katie Coleman is a 85 y.o. female.   85 yo female here for suture removal. Had facial/forehead sutures placed at [facility] ED. No complaints.   The history is provided by the patient.  Suture / Staple Removal     Past Medical History:  Diagnosis Date  . GERD (gastroesophageal reflux disease)   . Hypertension   . Thyroid disease     There are no active problems to display for this patient.   Past Surgical History:  Procedure Laterality Date  . CHOLECYSTECTOMY    . EYE SURGERY    . REPLACEMENT TOTAL KNEE BILATERAL      OB History   No obstetric history on file.      Home Medications    Prior to Admission medications   Medication Sig Start Date End Date Taking? Authorizing Provider  atenolol (TENORMIN) 100 MG tablet Take by mouth.   Yes [provider]  hydrochlorothiazide (HYDRODIURIL) 12.5 MG tablet Take by mouth. 04/06/18  Yes [provider]  levothyroxine (SYNTHROID, LEVOTHROID) 88 MCG tablet Take by mouth. 09/02/17  Yes [provider]  Multiple Vitamin (MULTI-VITAMINS) TABS Take by mouth.   Yes [provider]  ranitidine (ZANTAC) 75 MG tablet  03/28/18  Yes [provider]  rosuvastatin (CRESTOR) 20 MG tablet Take by mouth.   Yes [provider]  cephALEXin (KEFLEX) 500 MG capsule Take 1 capsule (500 mg total) by mouth 2 (two) times daily. 05/09/18   Payton Mccallum, MD    Family History History reviewed. No pertinent family history.  Social History Social History   Tobacco Use  . Smoking status: Never Smoker  . Smokeless tobacco: Never Used  Substance Use Topics  . Alcohol use: No  . Drug use: No     Allergies   Codeine; Hydrocodone; Latex; Oxycodone; Promethazine; and Sulfamethoxazole-trimethoprim   Review  of Systems Review of Systems   Physical Exam Triage Vital Signs ED Triage Vitals  Enc Vitals Group     BP 09/24/18 1437 (!) 198/173     Pulse Rate 09/24/18 1437 67     Resp 09/24/18 1437 18     Temp 09/24/18 1437 97.6 F (36.4 C)     Temp Source 09/24/18 1437 Oral     SpO2 09/24/18 1437 100 %     Weight 09/24/18 1444 162 lb (73.5 kg)     Height 09/24/18 1444 5\' 6"  (1.676 m)     Head Circumference --      Peak Flow --      Pain Score 09/24/18 1444 0     Pain Loc --      Pain Edu? --      Excl. in [place]? --    No data found.  Updated Vital Signs BP (!) 170/100   Pulse 67   Temp 97.6 F (36.4 C) (Oral)   Resp 18   Ht 5\' 6"  (1.676 m)   Wt 73.5 kg   SpO2 100%   BMI 26.15 kg/m   Visual Acuity Right Eye Distance:   Left Eye Distance:   Bilateral Distance:    Right Eye Near:   Left Eye Near:    Bilateral Near:     Physical Exam Constitutional:      General:  She is not in acute distress.    Appearance: Normal appearance. She is not ill-appearing or toxic-appearing.  Skin:    Comments: Sutures in place; wound healing well; no drainage or tenderness  Neurological:     Mental Status: She is alert.      UC Treatments / Results  Labs (all labs ordered are listed, but only abnormal results are displayed) Labs Reviewed - No data to display  EKG None  Radiology No results found.  Procedures Procedures (including critical care time)  Medications Ordered in UC Medications - No data to display  Initial Impression / Assessment and Plan / UC Course  I have reviewed the triage vital signs and the nursing notes.  Pertinent labs & imaging results that were available during my care of the patient were reviewed by me and considered in my medical decision making (see chart for details).      Final Clinical Impressions(s) / UC Diagnoses   Final diagnoses:  Encounter for removal of sutures  Essential hypertension     Discharge Instructions     Follow up  with primary care doctor for recheck blood pressure    ED Prescriptions    None     1. diagnosis reviewed with patient 2. Sutures removed by RN without complications 3. Discussed blood pressure with patient; continue current blood pressure medications and follow up with PCP this week for recheck  Controlled Substance Prescriptions [place] Controlled Substance Registry consulted? Not Applicable   Payton Mccallumonty, Marishka Rentfrow, MD 09/24/18 [phone]

## 2018-09-24 NOTE — Discharge Instructions (Signed)
Follow up with primary care doctor for recheck blood pressure

## 2020-01-17 IMAGING — CT CT MAXILLOFACIAL W/O CM
5 of 8 series · 17 of 47 positions shown, 18 images · non-contrast
Comparison: None.

Addendum:
CLINICAL DATA: Fall

EXAM:
CT HEAD WITHOUT CONTRAST
CT MAXILLOFACIAL WITHOUT CONTRAST
CT CERVICAL SPINE WITHOUT CONTRAST
TECHNIQUE: Multidetector CT imaging of the head, cervical spine, and
maxillofacial structures were performed using the standard protocol
without intravenous contrast. Multiplanar CT image reconstructions
of the cervical spine and maxillofacial structures were also
generated.

[Series 3: head wo · axial · 0.47mm/px · z∈[+473,+548]mm · 3 of 31 slices shown, 4 images]
[im 8/31  brain]
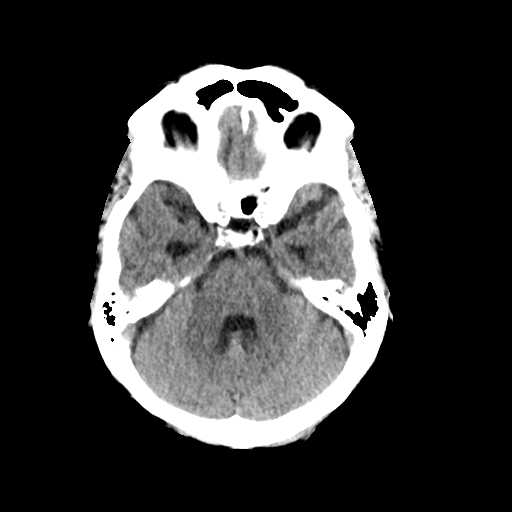
[im 8/31  bone]
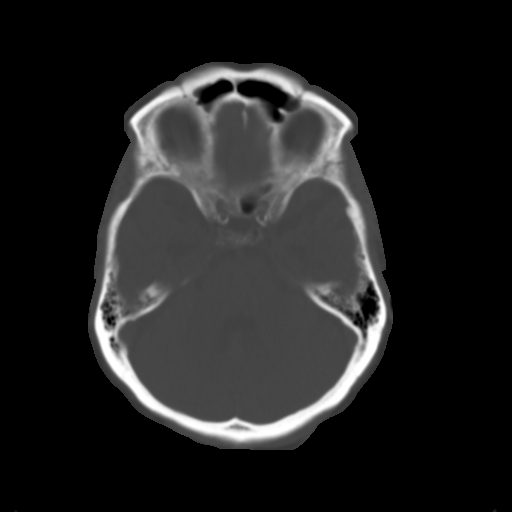
[im 16/31  bone]
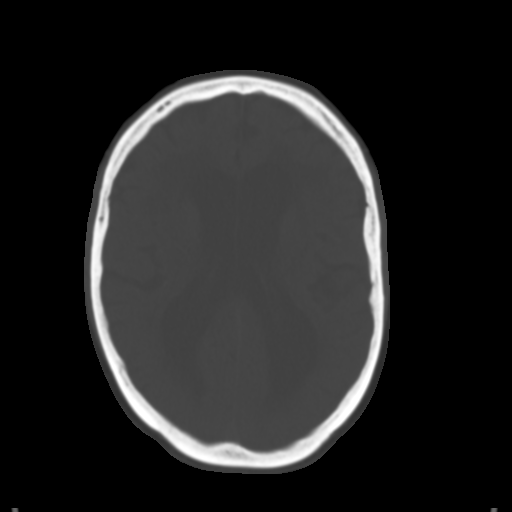
[im 23/31  bone]
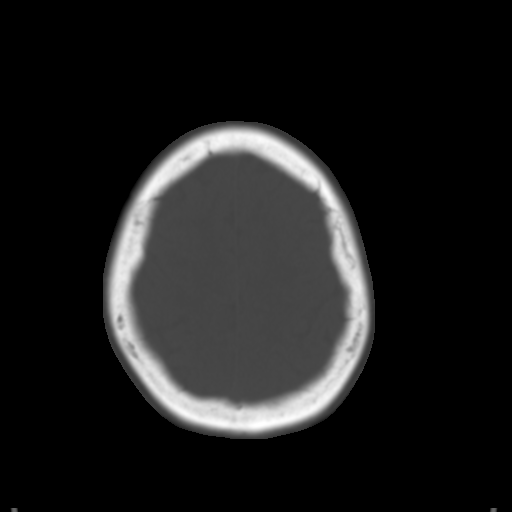

[Series 4: coronal soft tissue · coronal · 0.30mm/px · 3 of 67 slices shown]
[im 17/67  bone]
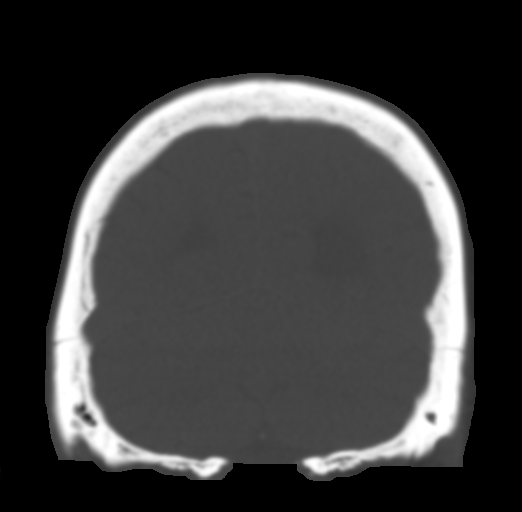
[im 26/67  bone]
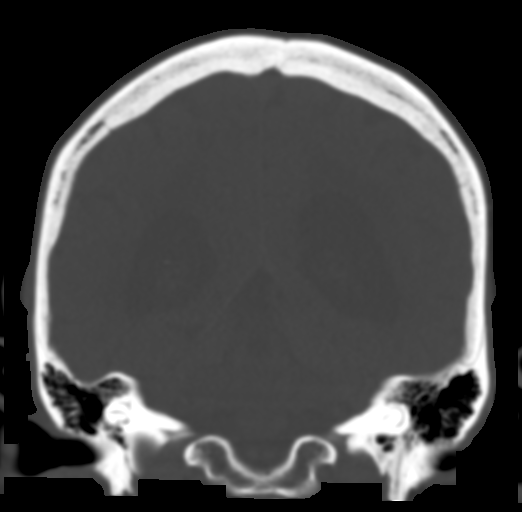
[im 34/67  bone]
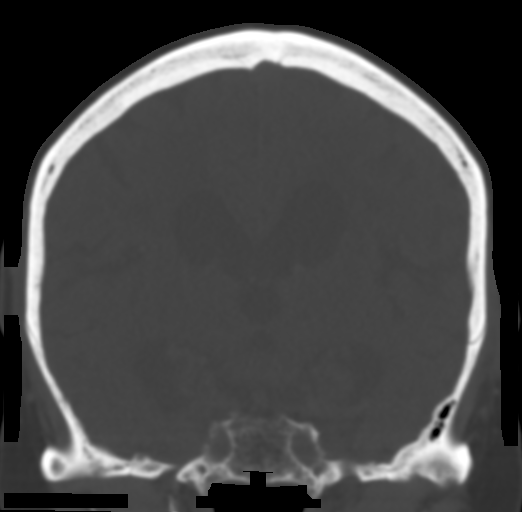

[Series 7: c spine soft · axial · 0.39mm/px · z∈[+316,+328]mm · 2 of 72 slices shown]
[im 7/72  brain]
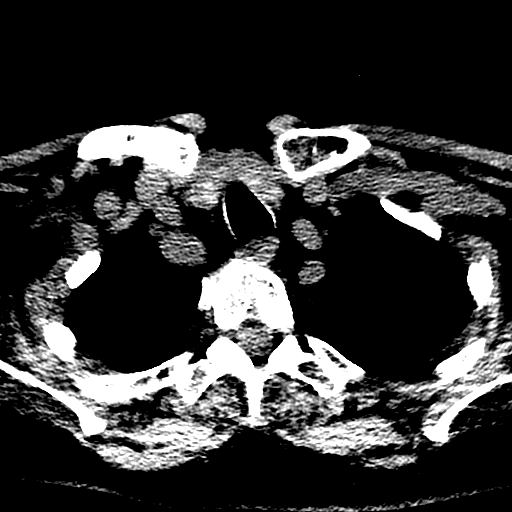
[im 13/72  brain]
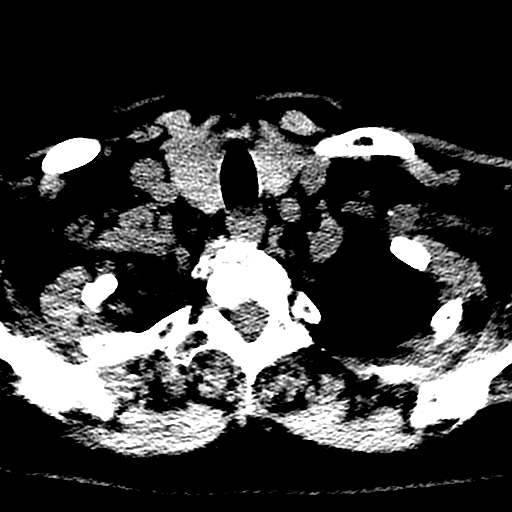

[Series 8: max soft · axial · 0.33mm/px · z∈[+348,+476]mm · 8 of 78 slices shown]
[im 7/78  brain]
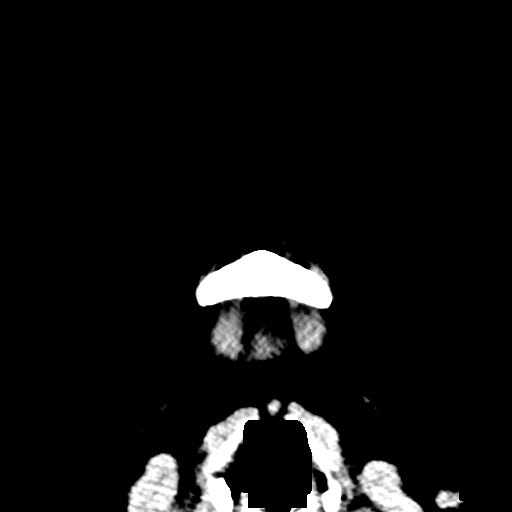
[im 20/78  brain]
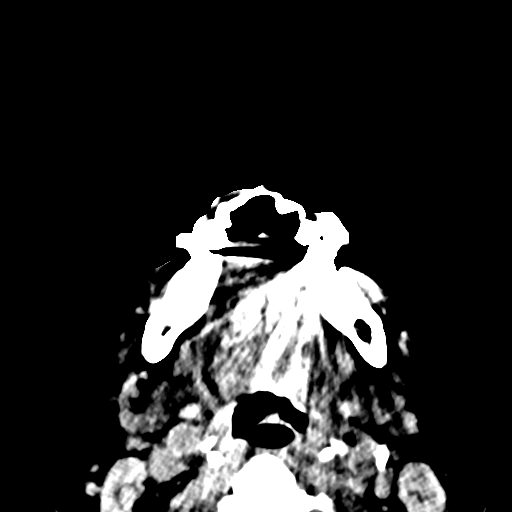
[im 26/78  brain]
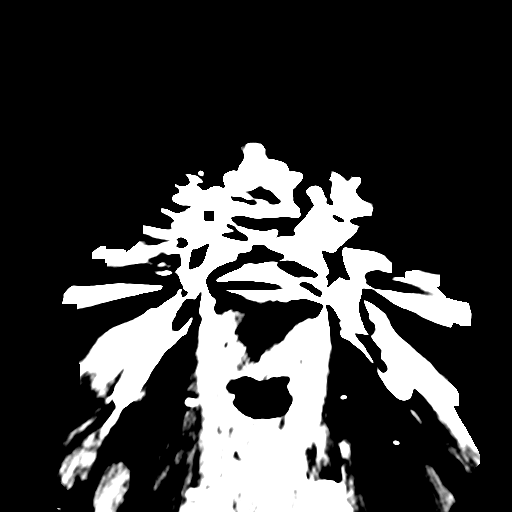
[im 33/78  brain]
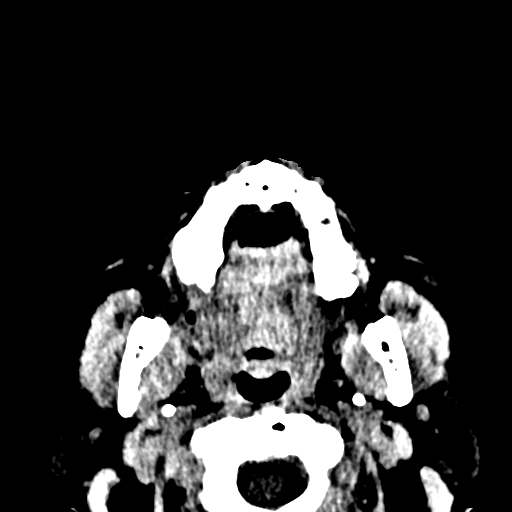
[im 45/78  brain]
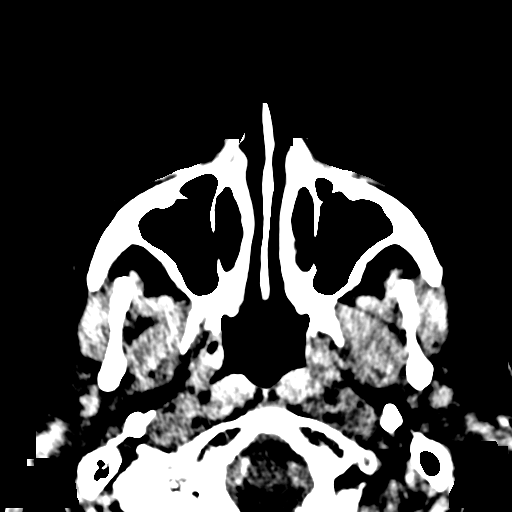
[im 52/78  brain]
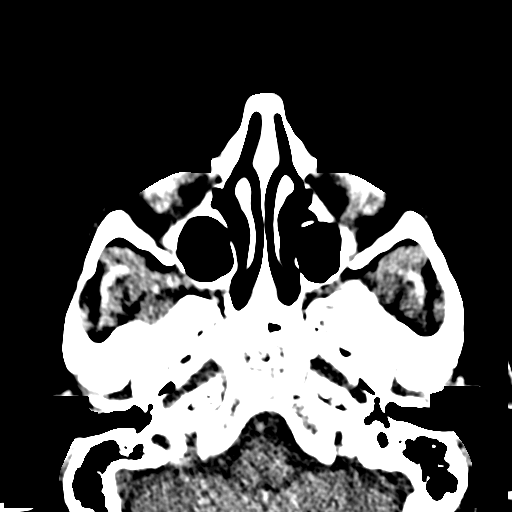
[im 58/78  brain]
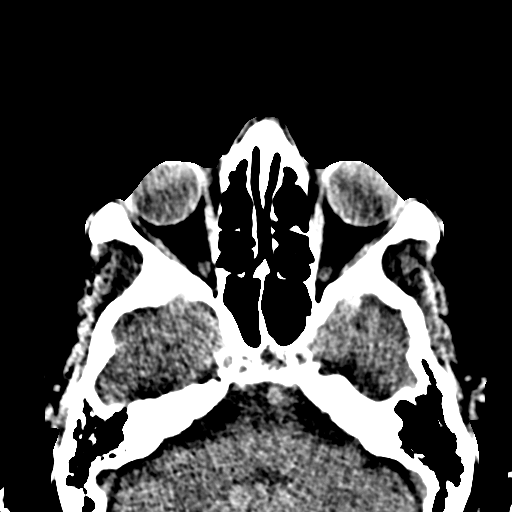
[im 71/78  brain]
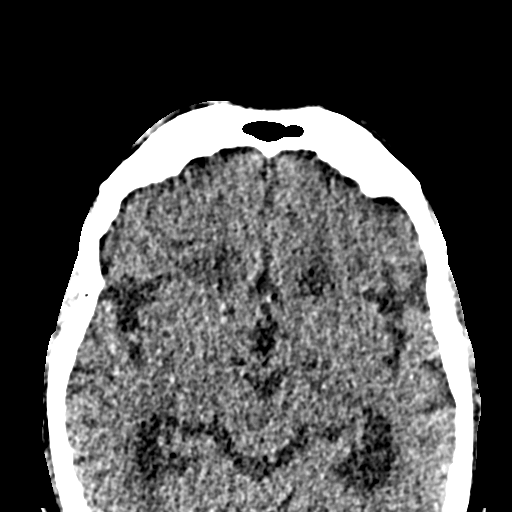

[Series 15: sagittal soft · sagittal · 0.24mm/px · 1 of 93 slices shown]
[im 47/93  bone]
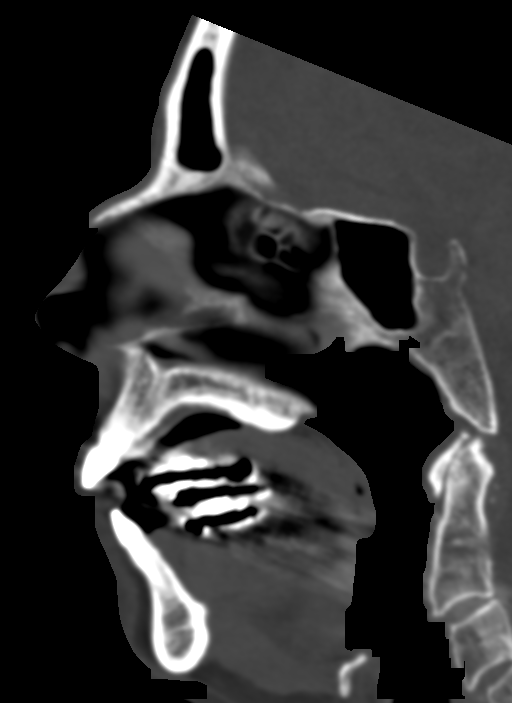

[17 of 47 positions shown; findings below may reference images not displayed]

FINDINGS: CT HEAD FINDINGS

Brain: No evidence of acute infarction, hemorrhage, hydrocephalus,
extra-axial collection or mass lesion/mass effect.

Subcortical white matter and periventricular small vessel ischemic
changes. Global cortical and central atrophy.

Vascular: Intracranial atherosclerosis.

Skull: Normal. Negative for fracture or focal lesion.

Other: Mild extracranial hematoma overlying the right frontal bone
(series 3/image 10).

CT MAXILLOFACIAL FINDINGS

Osseous: No evidence of maxillofacial fracture.

Mandible is intact. Bilateral mandibular condyles are well-seated in
the TMJs.

Orbits: Negative. No traumatic or inflammatory finding.

Sinuses: The visualized paranasal sinuses are essentially clear. The
mastoid air cells are unopacified.

Soft tissues: Negative.

CT CERVICAL SPINE FINDINGS

Alignment: Reversal of the normal mid cervical lordosis.

Skull base and vertebrae: No acute fracture. No primary bone lesion
or focal pathologic process.

Soft tissues and spinal canal: No prevertebral fluid or swelling. No
visible canal hematoma.

Disc levels: Mild to moderate degenerative changes of the mid
cervical spine. Spinal canal is patent.

Upper chest: 8 mm ground-glass nodule in the left lung apex (series
6/image 32), incompletely visualized.

Other: Visualized thyroid is unremarkable.
IMPRESSION: Mild intracranial hematoma overlying the right frontal bone. No
evidence of calvarial fracture. No evidence of acute intracranial
abnormality. Atrophy with small vessel ischemic changes.

No evidence of maxillofacial fracture.

No evidence of traumatic injury to the cervical spine. Mild to
moderate degenerative changes.

8 mm ground-glass nodule in the left lung apex, incompletely
visualized. Consider follow-up CT chest without contrast for further
characterization, as clinically warranted.

ADDENDUM:
Dictation error in the impression of the original report. As noted
in the findings, there is a mild EXTRAcranial hematoma overlying the
right frontal bone. The remainder of the report, including the
findings, are unchanged.

*** End of Addendum ***

## 2021-10-13 ENCOUNTER — Emergency Department: Payer: Medicare Other

## 2021-10-13 ENCOUNTER — Emergency Department
Admission: EM | Admit: 2021-10-13 | Discharge: 2021-10-13 | Disposition: A | Discharge status: Home / Self Care | Payer: Medicare Other | Attending: Emergency Medicine | Admitting: Emergency Medicine

## 2021-10-13 DIAGNOSIS — I129 Hypertensive chronic kidney disease with stage 1 through stage 4 chronic kidney disease, or unspecified chronic kidney disease: Secondary | ICD-10-CM | POA: Insufficient documentation

## 2021-10-13 DIAGNOSIS — E8809 Other disorders of plasma-protein metabolism, not elsewhere classified: Secondary | ICD-10-CM

## 2021-10-13 DIAGNOSIS — E876 Hypokalemia: Secondary | ICD-10-CM | POA: Insufficient documentation

## 2021-10-13 DIAGNOSIS — S31000A Unspecified open wound of lower back and pelvis without penetration into retroperitoneum, initial encounter: Secondary | ICD-10-CM

## 2021-10-13 DIAGNOSIS — N189 Chronic kidney disease, unspecified: Secondary | ICD-10-CM | POA: Diagnosis not present

## 2021-10-13 DIAGNOSIS — E039 Hypothyroidism, unspecified: Secondary | ICD-10-CM | POA: Diagnosis not present

## 2021-10-13 DIAGNOSIS — R770 Abnormality of albumin: Secondary | ICD-10-CM | POA: Insufficient documentation

## 2021-10-13 DIAGNOSIS — L89152 Pressure ulcer of sacral region, stage 2: Secondary | ICD-10-CM | POA: Insufficient documentation

## 2021-10-13 DIAGNOSIS — F0150 Vascular dementia without behavioral disturbance: Secondary | ICD-10-CM | POA: Insufficient documentation

## 2021-10-13 DIAGNOSIS — R600 Localized edema: Secondary | ICD-10-CM | POA: Diagnosis present

## 2021-10-13 DIAGNOSIS — M7989 Other specified soft tissue disorders: Secondary | ICD-10-CM

## 2021-10-13 LAB — COMPREHENSIVE METABOLIC PANEL
ALT: 12 U/L (ref 0–44)
AST: 20 U/L (ref 15–41)
Albumin: 2.8 g/dL — ABNORMAL LOW (ref 3.5–5.0)
Alkaline Phosphatase: 95 U/L (ref 38–126)
Anion gap: 6 (ref 5–15)
BUN: 13 mg/dL (ref 8–23)
CO2: 28 mmol/L (ref 22–32)
Calcium: 9.5 mg/dL (ref 8.9–10.3)
Chloride: 104 mmol/L (ref 98–111)
Creatinine, Ser: 0.82 mg/dL (ref 0.44–1.00)
GFR, Estimated: >60 mL/min (ref >60)
Glucose, Bld: 118 mg/dL — ABNORMAL HIGH (ref 70–99)
Potassium: 3.3 mmol/L — ABNORMAL LOW (ref 3.5–5.1)
Sodium: 138 mmol/L (ref 135–145)
Total Bilirubin: 0.4 mg/dL (ref 0.3–1.2)
Total Protein: 6.8 g/dL (ref 6.5–8.1)

## 2021-10-13 LAB — CBC WITH DIFFERENTIAL/PLATELET
Abs Immature Granulocytes: 0.04 10*3/uL (ref 0.00–0.07)
Basophils Absolute: 0 10*3/uL (ref 0.0–0.1)
Basophils Relative: 0 %
Eosinophils Absolute: 0.1 10*3/uL (ref 0.0–0.5)
Eosinophils Relative: 2 %
HCT: 36.4 % (ref 36.0–46.0)
Hemoglobin: 11.3 g/dL — ABNORMAL LOW (ref 12.0–15.0)
Immature Granulocytes: 1 %
Lymphocytes Relative: 13 %
Lymphs Abs: 1 10*3/uL (ref 0.7–4.0)
MCH: 26.9 pg (ref 26.0–34.0)
MCHC: 31 g/dL (ref 30.0–36.0)
MCV: 86.7 fL (ref 80.0–100.0)
Monocytes Absolute: 0.5 10*3/uL (ref 0.1–1.0)
Monocytes Relative: 6 %
Neutro Abs: 5.9 10*3/uL (ref 1.7–7.7)
Neutrophils Relative %: 78 %
Platelets: 353 10*3/uL (ref 150–400)
RBC: 4.2 MIL/uL (ref 3.87–5.11)
RDW: 15.4 % (ref 11.5–15.5)
WBC: 7.6 10*3/uL (ref 4.0–10.5)
nRBC: 0 % (ref 0.0–0.2)

## 2021-10-13 LAB — BRAIN NATRIURETIC PEPTIDE: B Natriuretic Peptide: 306.5 pg/mL — ABNORMAL HIGH (ref 0.0–100.0)

## 2021-10-13 MED ORDER — POTASSIUM CHLORIDE CRYS ER 20 MEQ PO TBCR
40.0000 meq | EXTENDED_RELEASE_TABLET | Freq: Once | ORAL | Status: AC
  Administered 2021-10-13: 40 meq via ORAL
  Filled 2021-10-13: qty 2

## 2021-10-13 NOTE — ED Triage Notes (Signed)
Pt arrives via [facility] from [facility] c/o bilateral leg edema. Pt not c/o CP, SOB. Pt with +4 pitting edema to BLE on triage.

## 2021-10-13 NOTE — ED Provider Notes (Signed)
[facility] Provider Note    Event Date/Time   First MD Initiated Contact with Patient 10/13/21 1315     (approximate)   History   Leg Swelling   HPI  Katie Coleman Threat is a 88 y.o. female with a past medical history of vascular dementia, CKD, HTN, hypothyroidism, anxiety and some baseline gait instability who presents EMS for evaluation of some elevated blood pressures noted earlier today with reported BP of 190/98 as well as increased swelling in the legs over the last 3 to 4 days.  Patient denies any headache, chest pain, shortness of breath, nausea, vomiting, dizziness, vision changes or any other acute sick symptoms.  She denies any injuries or falls or any pain in her legs.  She notes she has chronic swelling.  She is not sure how long it has been going on.  She is oriented to month year and location but further history is limited from the patient secondary to some underlying dementia.  I was able to reach staff at facility who confirmed these concerns and communicated there are felt the left lower extremity was slightly more edematous than the right.  No other recent sick symptoms per staff.      Physical Exam  Triage Vital Signs: ED Triage Vitals  Enc Vitals Group     BP 10/13/21 1322 (!) 147/66     Pulse Rate 10/13/21 1322 73     Resp 10/13/21 1322 17     Temp 10/13/21 1322 98.3 F (36.8 C)     Temp Source 10/13/21 1322 Oral     SpO2 10/13/21 1322 97 %     Weight 10/13/21 1317 164 lb (74.4 kg)     Height 10/13/21 1317 5\' 6"  (1.676 m)     Head Circumference --      Peak Flow --      Pain Score 10/13/21 1317 0     Pain Loc --      Pain Edu? --      Excl. in [place]? --     Most recent vital signs: Vitals:   10/13/21 1322 10/13/21 1400  BP: (!) 147/66 (!) 117/56  Pulse: 73 65  Resp: 17 14  Temp: 98.3 F (36.8 C)   SpO2: 97% 96%    General: Awake, no distress.  CV:  Good peripheral perfusion.  2+ radial pulses.  No murmurs rubs or  gallops. Resp:  Normal effort.  No hypoxia tachypnea or increased work of breathing or abnormal breath sounds. Abd:  No distention.  Soft throughout. Other:  Patient is able to move her upper extremities with symmetric strength in her toes and lower extremities symmetrically.  Cranial nerves II through XII are grossly intact.  She is pleasantly demented and confused but at her baseline I confirmed this with son over the phone.  Stage II sacral decubitus wound without any purulent drainage, bleeding, surrounding induration tenderness warmth or other significant skin changes.   ED Results / Procedures / Treatments  Labs (all labs ordered are listed, but only abnormal results are displayed) Labs Reviewed  COMPREHENSIVE METABOLIC PANEL - Abnormal; Notable for the following components:      Result Value   Potassium 3.3 (*)    Glucose, Bld 118 (*)    Albumin 2.8 (*)    All other components within normal limits  CBC WITH DIFFERENTIAL/PLATELET - Abnormal; Notable for the following components:   Hemoglobin 11.3 (*)    All other components within normal  limits  BRAIN NATRIURETIC PEPTIDE - Abnormal; Notable for the following components:   B Natriuretic Peptide 306.5 (*)    All other components within normal limits     EKG  ECG is remarkable sinus rhythm with a ventricular rate of 66, normal axis, unremarkable intervals without clear evidence of acute ischemia or significant arrhythmia although is little bit of artifact in leads II and III.   RADIOLOGY  Bilateral lower extremity ultrasound interpreted myself shows no evidence of DVT or abscess.  Also reviewed radiology interpretation and agree with the findings of same.  PROCEDURES:  Critical Care performed: No  .1-3 Lead EKG Interpretation Performed by: Lucrezia Starch, MD Authorized by: Lucrezia Starch, MD     Interpretation: non-specific     ECG rate assessment: bradycardic     Rhythm: sinus rhythm     Ectopy: none      Conduction: normal    The patient is on the cardiac monitor to evaluate for evidence of arrhythmia and/or significant heart rate changes.   MEDICATIONS ORDERED IN ED: Medications  potassium chloride SA (KLOR-CON M) CR tablet 40 mEq (40 mEq Oral Given 10/13/21 1526)     IMPRESSION / MDM / ASSESSMENT AND PLAN / ED COURSE  I reviewed the triage vital signs and the nursing notes.                              Differential diagnosis includes, but is not limited to edema secondary to DVT, volume overload, hypoalbuminemia, nephrosis as there is no evidence on exam cellulitis or clear infectious process.   Patient's and I spoke with also requested a look at patient's buttocks there is evidence of a noninfected sacral decubitus wound.  Dressing applied emergency room.   Patient might be very slightly volume overloaded she has no complaints of shortness of breath or chest pain and is not hypoxic tachypneic tachycardic or otherwise showing any evidence of respiratory distress.  Her lungs are clear bilaterally.  BNP is very slightly elevated at 306 given absence of any other respiratory symptoms or signs of distress I do not believe this requires emergent diuresis and I think can be followed up outpatient.  Bilateral lower extremity ultrasound interpreted myself shows no evidence of DVT or abscess.  Also reviewed radiology interpretation and agree with the findings of same.  CBC shows no leukocytosis and hemoglobin 11.3.  Platelets are normal.  CMP is remarkable for K of 3.3 and albumin of 2.8 without any other significant lecture light or metabolic derangements.  Potassium repleted.  I suspect hypoalbuminemia possibly related to poor nutritional intake is likely contributing factor to her edema.  Discussed with son recommendation for supportive care including compression elevation although seems are struggling with this as patient typically does not want to this.  Given stable vitals otherwise reassuring  exam work-up and low suspicion for other immediate life-threatening pathology I think patient stable for discharge with continued outpatient evaluation.  Discharged in stable condition.  Strict return precautions advised and discussed      FINAL CLINICAL IMPRESSION(S) / ED DIAGNOSES   Final diagnoses:  Leg swelling  Hypoalbuminemia  Hypokalemia  Wound of sacral region, initial encounter     Rx / DC Orders   ED Discharge Orders     None        Note:  This document was prepared using Dragon voice recognition software and may include unintentional dictation errors.  Lucrezia Starch, MD 10/13/21 [phone]

## 2023-02-12 IMAGING — US US EXTREM LOW VENOUS
1 series · 13 of 24 positions shown · non-contrast
Comparison: None.

CLINICAL DATA: Bilateral lower extremity edema



[Series 1: us venous img lower bilat (dvt) · portal-venous · 13 of 57 slices shown]
[im 1/57]
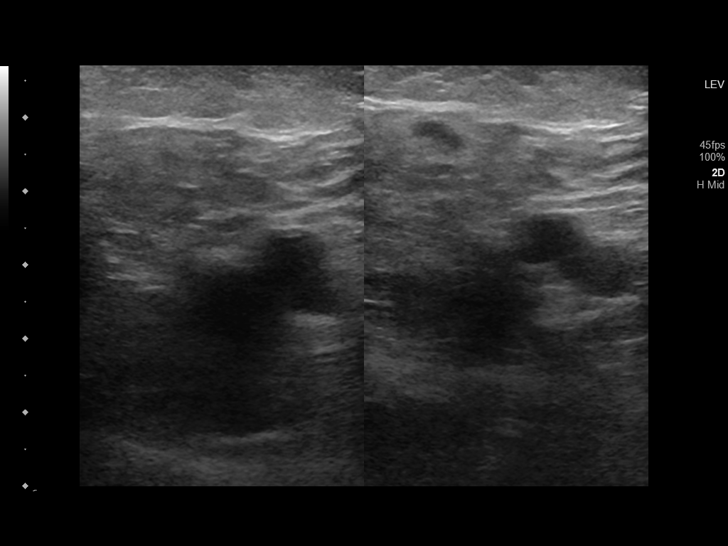
[im 5/57]
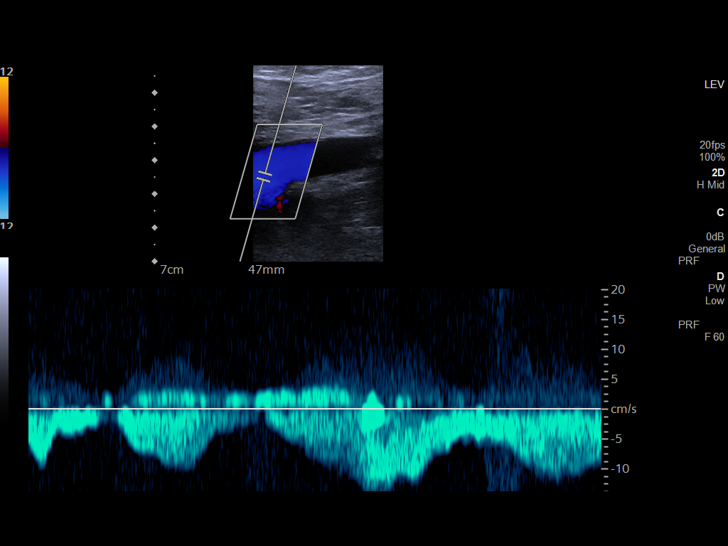
[im 10/57]
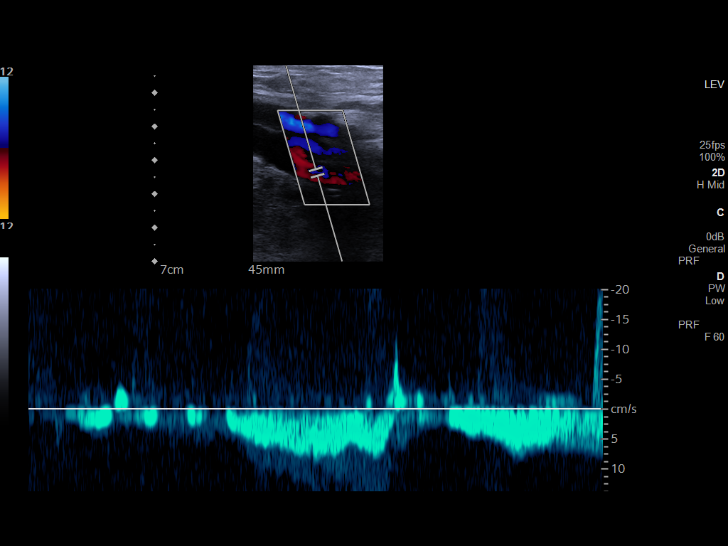
[im 15/57]
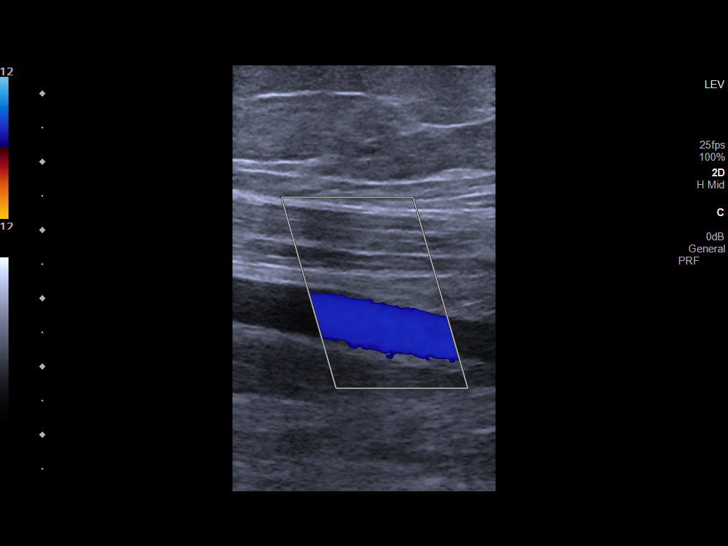
[im 20/57]
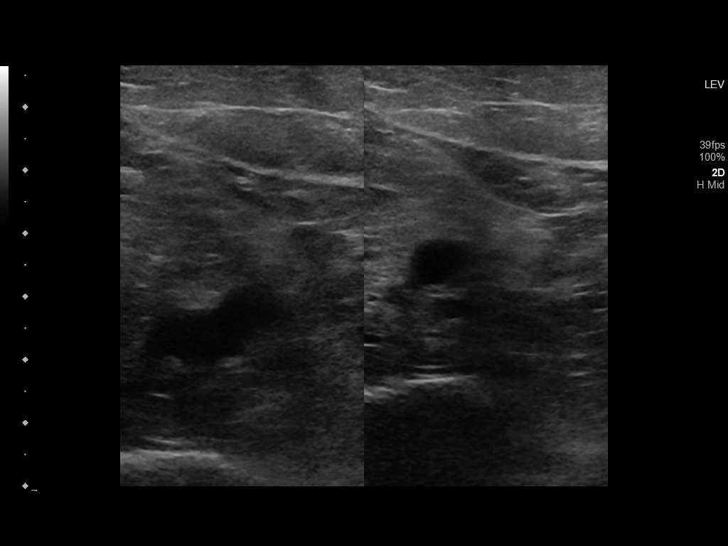
[im 25/57]
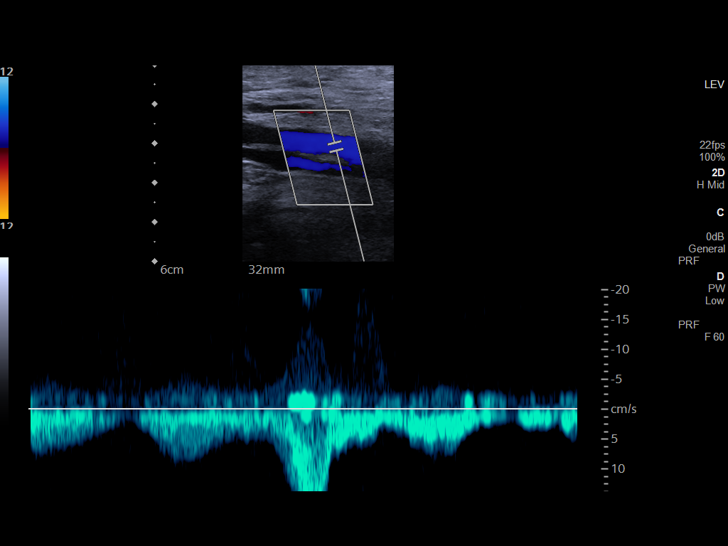
[im 30/57]
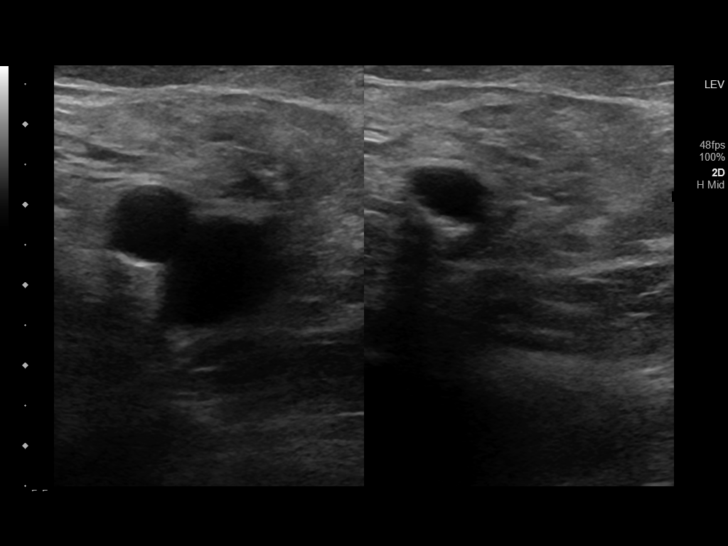
[im 32/57]
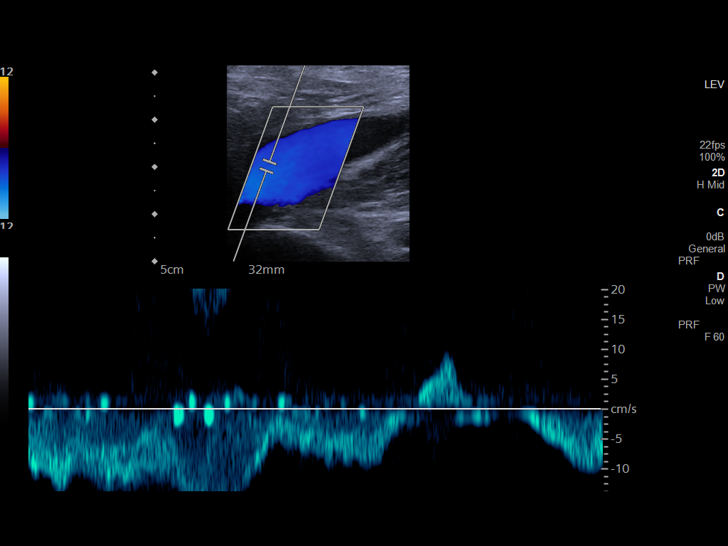
[im 37/57]
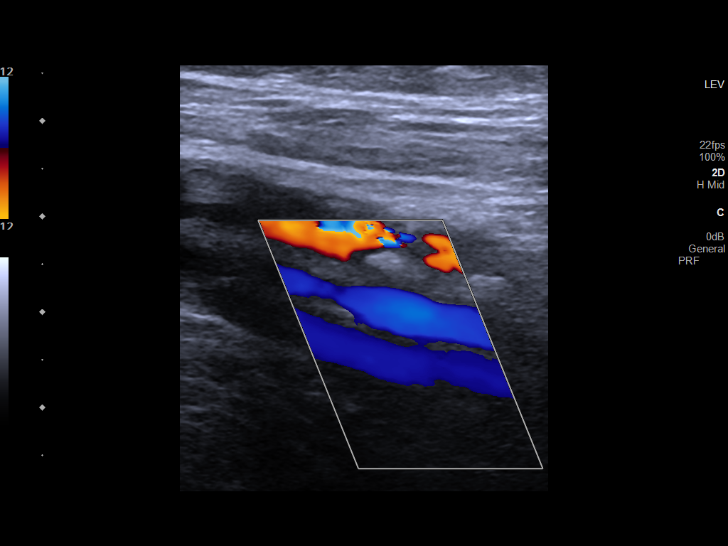
[im 42/57]
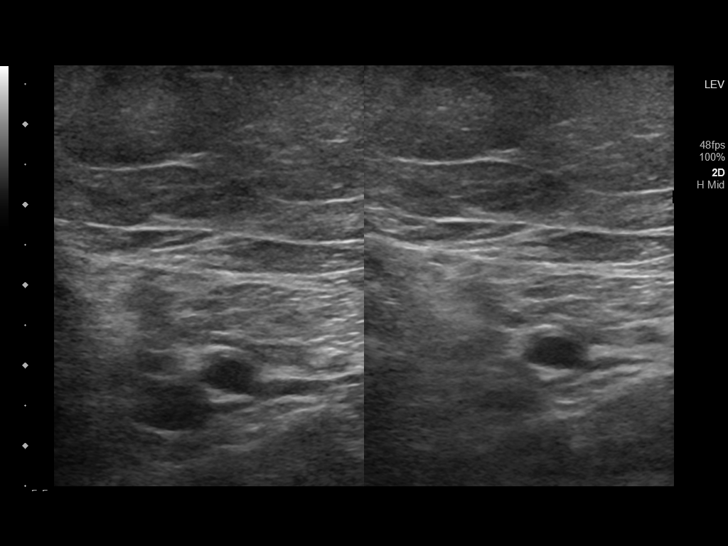
[im 47/57]
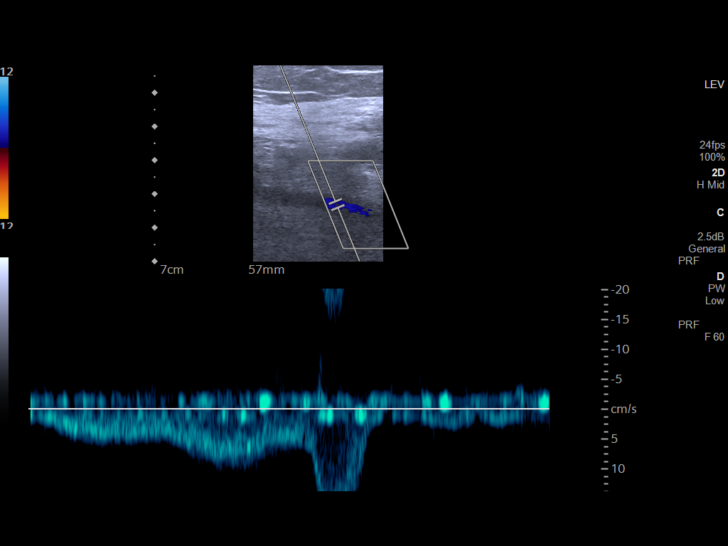
[im 52/57]
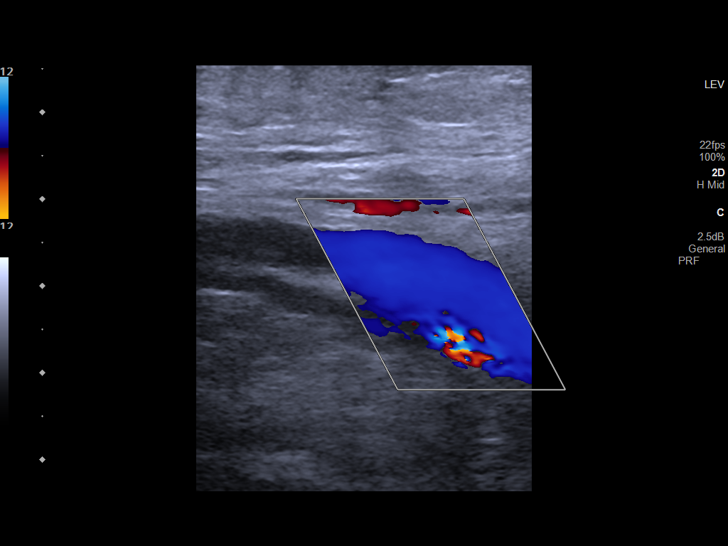
[im 57/57]
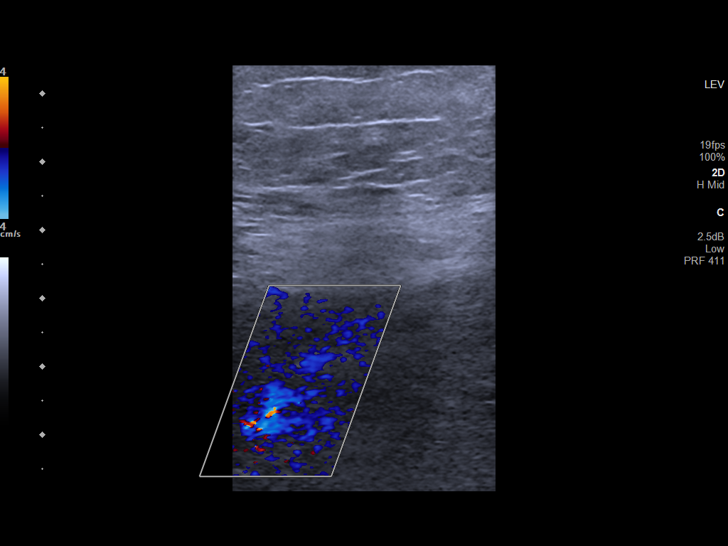

[13 of 24 positions shown; findings below may reference images not displayed]

FINDINGS: RIGHT LOWER EXTREMITY

Common Femoral Vein: No evidence of thrombus. Normal
compressibility, respiratory phasicity and response to augmentation.

Saphenofemoral Junction: No evidence of thrombus. Normal
compressibility and flow on color Doppler imaging.

Profunda Femoral Vein: No evidence of thrombus. Normal
compressibility and flow on color Doppler imaging.

Femoral Vein: No evidence of thrombus. Normal compressibility,
respiratory phasicity and response to augmentation.

Popliteal Vein: No evidence of thrombus. Normal compressibility,
respiratory phasicity and response to augmentation.

Calf Veins: No evidence of thrombus. Normal compressibility and flow
on color Doppler imaging.

Superficial Great Saphenous Vein: No evidence of thrombus. Normal
compressibility.

Venous Reflux:  None.

Other Findings:  None.

LEFT LOWER EXTREMITY

Common Femoral Vein: No evidence of thrombus. Normal
compressibility, respiratory phasicity and response to augmentation.

Saphenofemoral Junction: No evidence of thrombus. Normal
compressibility and flow on color Doppler imaging.

Profunda Femoral Vein: No evidence of thrombus. Normal
compressibility and flow on color Doppler imaging.

Femoral Vein: No evidence of thrombus. Normal compressibility,
respiratory phasicity and response to augmentation.

Popliteal Vein: No evidence of thrombus. Normal compressibility,
respiratory phasicity and response to augmentation.

Calf Veins: No evidence of thrombus. Normal compressibility and flow
on color Doppler imaging.

Superficial Great Saphenous Vein: No evidence of thrombus. Normal
compressibility.

Venous Reflux:  None.

Other Findings:  None.
IMPRESSION: No evidence of deep venous thrombosis in either lower extremity.

## 2024-01-05 DEATH — deceased
